# Patient Record
Sex: Male | Born: 1945 | Race: White | Hispanic: No | Marital: Married | Smoking: Never smoker
Health system: Southern US, Community
[De-identification: ages and names within clinical notes are randomized; demographics above are authoritative.]

## PROBLEM LIST (undated history)

## (undated) DIAGNOSIS — D649 Anemia, unspecified: Secondary | ICD-10-CM

## (undated) DIAGNOSIS — I10 Essential (primary) hypertension: Secondary | ICD-10-CM

## (undated) DIAGNOSIS — R011 Cardiac murmur, unspecified: Secondary | ICD-10-CM

## (undated) DIAGNOSIS — Q400 Congenital hypertrophic pyloric stenosis: Secondary | ICD-10-CM

## (undated) DIAGNOSIS — I350 Nonrheumatic aortic (valve) stenosis: Secondary | ICD-10-CM

## (undated) DIAGNOSIS — J189 Pneumonia, unspecified organism: Secondary | ICD-10-CM

## (undated) DIAGNOSIS — K219 Gastro-esophageal reflux disease without esophagitis: Secondary | ICD-10-CM

## (undated) DIAGNOSIS — E785 Hyperlipidemia, unspecified: Secondary | ICD-10-CM

## (undated) DIAGNOSIS — R55 Syncope and collapse: Secondary | ICD-10-CM

## (undated) DIAGNOSIS — D500 Iron deficiency anemia secondary to blood loss (chronic): Secondary | ICD-10-CM

## (undated) HISTORY — PX: COLONOSCOPY: SHX174

## (undated) HISTORY — DX: Anemia, unspecified: D64.9

## (undated) HISTORY — PX: TONSILLECTOMY: SUR1361

## (undated) HISTORY — DX: Gastro-esophageal reflux disease without esophagitis: K21.9

## (undated) HISTORY — DX: Cardiac murmur, unspecified: R01.1

## (undated) HISTORY — DX: Essential (primary) hypertension: I10

## (undated) HISTORY — DX: Nonrheumatic aortic (valve) stenosis: I35.0

## (undated) HISTORY — DX: Hyperlipidemia, unspecified: E78.5

## (undated) HISTORY — PX: BLEPHAROPLASTY: SUR158

## (undated) HISTORY — PX: CHOLECYSTECTOMY: SHX55

## (undated) HISTORY — PX: EYE SURGERY: SHX253

## (undated) HISTORY — DX: Iron deficiency anemia secondary to blood loss (chronic): D50.0

## (undated) HISTORY — PX: UPPER GASTROINTESTINAL ENDOSCOPY: SHX188

---

## 2013-03-19 ENCOUNTER — Emergency Department (HOSPITAL_COMMUNITY): Admission: EM | Admit: 2013-03-19 | Discharge: 2013-03-19 | Discharge status: Against Medical Advice | Payer: Self-pay

## 2015-10-17 HISTORY — PX: BLEPHAROPLASTY: SUR158

## 2017-01-16 ENCOUNTER — Encounter: Payer: Self-pay | Admitting: Gastroenterology

## 2017-01-25 ENCOUNTER — Encounter: Payer: Self-pay | Admitting: Gastroenterology

## 2017-01-25 ENCOUNTER — Encounter (INDEPENDENT_AMBULATORY_CARE_PROVIDER_SITE_OTHER): Payer: Self-pay

## 2017-01-25 ENCOUNTER — Ambulatory Visit (INDEPENDENT_AMBULATORY_CARE_PROVIDER_SITE_OTHER): Payer: MEDICARE | Admitting: Gastroenterology

## 2017-01-25 ENCOUNTER — Other Ambulatory Visit (INDEPENDENT_AMBULATORY_CARE_PROVIDER_SITE_OTHER): Payer: MEDICARE

## 2017-01-25 VITALS — BP 132/78 | HR 78 | Ht 67.0 in | Wt 157.0 lb

## 2017-01-25 DIAGNOSIS — D649 Anemia, unspecified: Secondary | ICD-10-CM

## 2017-01-25 DIAGNOSIS — R195 Other fecal abnormalities: Secondary | ICD-10-CM

## 2017-01-25 DIAGNOSIS — D500 Iron deficiency anemia secondary to blood loss (chronic): Secondary | ICD-10-CM

## 2017-01-25 HISTORY — DX: Iron deficiency anemia secondary to blood loss (chronic): D50.0

## 2017-01-25 LAB — CBC WITH DIFFERENTIAL/PLATELET
BASOS ABS: 0.1 10*3/uL (ref 0.0–0.1)
Basophils Relative: 0.7 % (ref 0.0–3.0)
EOS ABS: 0.2 10*3/uL (ref 0.0–0.7)
Eosinophils Relative: 2.8 % (ref 0.0–5.0)
HCT: 31.6 % — ABNORMAL LOW (ref 39.0–52.0)
Hemoglobin: 9.7 g/dL — ABNORMAL LOW (ref 13.0–17.0)
LYMPHS ABS: 1.6 10*3/uL (ref 0.7–4.0)
Lymphocytes Relative: 23 % (ref 12.0–46.0)
MCHC: 30.8 g/dL (ref 30.0–36.0)
MCV: 71.3 fl — AB (ref 78.0–100.0)
MONOS PCT: 9.6 % (ref 3.0–12.0)
Monocytes Absolute: 0.7 10*3/uL (ref 0.1–1.0)
NEUTROS ABS: 4.4 10*3/uL (ref 1.4–7.7)
NEUTROS PCT: 63.9 % (ref 43.0–77.0)
PLATELETS: 327 10*3/uL (ref 150.0–400.0)
RBC: 4.44 Mil/uL (ref 4.22–5.81)
RDW: 17.1 % — ABNORMAL HIGH (ref 11.5–15.5)
WBC: 7 10*3/uL (ref 4.0–10.5)

## 2017-01-25 MED ORDER — PANTOPRAZOLE SODIUM 40 MG PO TBEC: 40.0000 mg | DELAYED_RELEASE_TABLET | Freq: Two times a day (BID) | ORAL | 0 refills | Status: DC

## 2017-01-25 NOTE — Patient Instructions (Addendum)
Your physician has requested that you go to the basement for the following lab work before leaving today: CBC  You have been scheduled for an endoscopy. Please follow written instructions given to you at your visit today. If you use inhalers (even only as needed), please bring them with you on the day of your procedure.  We have sent the following medications to your pharmacy for you to pick up at your convenience: Pantoprazole  take twice a day.  Normal BMI (Body Mass Index- based on height and weight) is between 23 and 30. Your BMI today is Body mass index is 24.59 kg/m. Marland Kitchen Please consider follow up  regarding your BMI with your Primary Care Provider.  Thank you

## 2017-01-25 NOTE — Progress Notes (Addendum)
01/25/2017 Gaylene Brooks [id number] [birth year]  CC:  Anemia and heme positive stool  HISTORY OF PRESENT ILLNESS:  This is a 71 year old male who is new to our office. His previous GI physician was Dr. Aleene Davidson in [place], [place]. He underwent colonoscopy in January 2014 at which time the study was normal except for external hemorrhoids. He had an EGD in May 2015 at which time he was found to have a 2 cm hiatal hernia. Esophagus was biopsied and showed changes consistent with reflux esophagitis. He has been maintained on omeprazole daily since that time. He says that on Easter he had a syncopal episode at church for which he was taken to the emergency department. During is hospitalization there he underwent extensive cardiac evaluation. He does have aortic stenosis so that was re-evaluated.  Everything checked out from a cardiac standpoint to be stable. He started having severe black diarrhea while he was in the hospital. He said that he thinks he probably had 50 bowel movements in 1 day. C. difficile was negative. Stools were Hemoccult positive. When he presented to the emergency department his hemoglobin was around 10 g. From the notes it appears that his baseline had been about 12-13 g. They apparently did not have GI coverage at the hospital at that time so they elected to monitor him and transfer him out if he became unstable from a GI standpoint. While he was there the black stools resolved as well as the diarrhea. Hemoglobin did not go any lower then about 9 g or so. He was discharged and told to follow up with GI as an outpatient. He tells me that he is feeling great. He is on pantoprazole 40 mg twice a day, which he was switched to during the hospital stay from his daily omeprazole. He was having some epigastric pain as well and that has also resolved. He denies NSAID use. He takes a baby aspirin daily.   No past medical history on file. No past surgical history on file.  has no  tobacco, alcohol, and drug history on file. family history is not on file. Allergies  Allergen Reactions  . Levaquin [Levofloxacin In D5w]       No outpatient encounter prescriptions on file as of 01/25/2017.   No facility-administered encounter medications on file as of 01/25/2017.      REVIEW OF SYSTEMS  : All other systems reviewed and negative except where noted in the History of Present Illness.   PHYSICAL EXAM: Ht  (1.702 m)   Wt 157 lb (71.2 kg)   BMI 24.59 kg/m  General: Well developed white male in no acute distress Head: Normocephalic and atraumatic Eyes:  Sclerae anicteric, conjunctiva pink. Ears: Normal auditory acuity Lungs: Clear throughout to auscultation Heart: Regular rate and rhythm; harsh systolic murmur noted. Abdomen: Soft, non-distended. Normal bowel sounds.  Non-tender. Musculoskeletal: Symmetrical with no gross deformities  Skin: No lesions on visible extremities Extremities: No edema  Neurological: Alert oriented x 4, grossly non-focal Psychological:  Alert and cooperative. Normal mood and affect  ASSESSMENT AND PLAN: *70 year old male recently hospitalized in [place] after a syncopal episode. Hemoglobin found to be down 2-3 g from his baseline. He was having diarrhea with black stools, which were Hemoccult positive. Cardiac evaluation checked out okay in regards to the syncope. He is here for outpatient GI follow-up.  On pantoprazole 40 mg BID.  Feels much better.  I suspect that he may have had an UGIB from an ulcer or  other source.  Will schedule for EGD with Dr. Leone Payor.  Will continue pantoprazole 40 mg BID for now.  Will check CBC today.  **The risks, benefits, and alternatives to EGD were discussed with the patient and he consents to proceed.   Agree with Ms. Rise Mu management.  EGD ended up being essentially negative but turns out he has been a regular blood donor so suspect that was cause of Fe Defic anemia  Iva Boop, MD,  [facility]

## 2017-02-21 ENCOUNTER — Encounter: Payer: Self-pay | Admitting: Internal Medicine

## 2017-02-21 ENCOUNTER — Ambulatory Visit (AMBULATORY_SURGERY_CENTER): Payer: MEDICARE | Admitting: Internal Medicine

## 2017-02-21 VITALS — BP 132/66 | HR 68 | Temp 98.9°F | Resp 10 | Ht 67.0 in | Wt 157.0 lb

## 2017-02-21 DIAGNOSIS — K921 Melena: Secondary | ICD-10-CM

## 2017-02-21 DIAGNOSIS — D509 Iron deficiency anemia, unspecified: Secondary | ICD-10-CM

## 2017-02-21 DIAGNOSIS — D500 Iron deficiency anemia secondary to blood loss (chronic): Secondary | ICD-10-CM

## 2017-02-21 MED ORDER — SODIUM CHLORIDE 0.9 % IV SOLN: 500.0000 mL | INTRAVENOUS | Status: DC

## 2017-02-21 MED ORDER — PANTOPRAZOLE SODIUM 40 MG PO TBEC: 40.0000 mg | DELAYED_RELEASE_TABLET | Freq: Every day | ORAL | 0 refills | Status: DC

## 2017-02-21 MED ORDER — PANTOPRAZOLE SODIUM 40 MG PO TBEC: 40.0000 mg | DELAYED_RELEASE_TABLET | Freq: Every day | ORAL | 3 refills | Status: DC

## 2017-02-21 MED ORDER — FERROUS SULFATE 325 (65 FE) MG PO TABS: 325.0000 mg | ORAL_TABLET | Freq: Two times a day (BID) | ORAL | 3 refills | Status: DC

## 2017-02-21 NOTE — Progress Notes (Signed)
Pt's states no medical or surgical changes since previsit or office visit. 

## 2017-02-21 NOTE — Patient Instructions (Addendum)
This exam was normal - no cause of black stools and bleeding seen.  Your red blood cells are small and this is consistent with low iron from the bleeding and possibly other causes.  Please start ferrous sulfate 325 mg twice a day (iron pill) can get at any pharmacy. It will make stools dark again.  Change pantoprazole to once a day before breakfast.  Since this test did not give us an answer I am recommending a colonoscopy - it was right to do this one but since no answer need to check the colon. We will arrange this.  I appreciate the opportunity to care for you. Iva Booparl E. Lucrezia Dehne, MD, FACG   YOU HAD AN ENDOSCOPIC PROCEDURE TODAY AT THE [facility]:   Refer to the procedure report that was given to you for any specific questions about what was found during the examination.  If the procedure report does not answer your questions, please call your gastroenterologist to clarify.  If you requested that your care partner not be given the details of your procedure findings, then the procedure report has been included in a sealed envelope for you to review at your convenience later.  YOU SHOULD EXPECT: Some feelings of bloating in the abdomen. Passage of more gas than usual.  Walking can help get rid of the air that was put into your GI tract during the procedure and reduce the bloating.   Please Note:  You might notice some irritation and congestion in your nose or some drainage.  This is from the oxygen used during your procedure.  There is no need for concern and it should clear up in a day or so.  SYMPTOMS TO REPORT IMMEDIATELY:    Following upper endoscopy (EGD)  Vomiting of blood or coffee ground material  New chest pain or pain under the shoulder blades  Painful or persistently difficult swallowing  New shortness of breath  Fever of 100F or higher  Black, tarry-looking stools  For urgent or emergent issues, a gastroenterologist can be reached at any hour by  calling [phone].   DIET:  We do recommend a small meal at first, but then you may proceed to your regular diet.  Drink plenty of fluids but you should avoid alcoholic beverages for 24 hours.  ACTIVITY:  You should plan to take it easy for the rest of today and you should NOT DRIVE or use heavy machinery until tomorrow (because of the sedation medicines used during the test).    FOLLOW UP: Our staff will call the number listed on your records the next business day following your procedure to check on you and address any questions or concerns that you may have regarding the information given to you following your procedure. If we do not reach you, we will leave a message.  However, if you are feeling well and you are not experiencing any problems, there is no need to return our call.  We will assume that you have returned to your regular daily activities without incident.  If any biopsies were taken you will be contacted by phone or by letter within the next 1-3 weeks.  Please call us at [phone] if you have not heard about the biopsies in 3 weeks.    SIGNATURES/CONFIDENTIALITY: You and/or your care partner have signed paperwork which will be entered into your electronic medical record.  These signatures attest to the fact that that the information above on your After Visit Summary has  been reviewed and is understood.  Full responsibility of the confidentiality of this discharge information lies with you and/or your care-partner.  Dr. Has changed his mind about the colonoscopy.  You are to come to see him in about two months.

## 2017-02-21 NOTE — Progress Notes (Signed)
Report to PACU, RN, vss, BBS= Clear.  

## 2017-02-21 NOTE — Op Note (Addendum)
[facility] Patient Name: Brent Jordan Procedure Date: 02/21/2017 3:38 PM MRN: [id number] Endoscopist: Iva Boop , MD Age: 71 Referring MD:  Date of Birth: [birth year] Gender: Male Account #: [id number] Procedure:                Upper GI endoscopy Indications:              Iron deficiency anemia, Melena Medicines:                Propofol per Anesthesia, Monitored Anesthesia Care Procedure:                Pre-Anesthesia Assessment:                           - Prior to the procedure, a History and Physical                            was performed, and patient medications and                            allergies were reviewed. The patient's tolerance of                            previous anesthesia was also reviewed. The risks                            and benefits of the procedure and the sedation                            options and risks were discussed with the patient.                            All questions were answered, and informed consent                            was obtained. Prior Anticoagulants: The patient                            last took aspirin 1 day and previous NSAID                            medication 1 day prior to the procedure. ASA Grade                            Assessment: II - A patient with mild systemic                            disease. After reviewing the risks and benefits,                            the patient was deemed in satisfactory condition to                            undergo the procedure.  After obtaining informed consent, the endoscope was                            passed under direct vision. Throughout the                            procedure, the patient's blood pressure, pulse, and                            oxygen saturations were monitored continuously. The                            Endoscope was introduced through the mouth, and                            advanced to the fourth part  of duodenum. The upper                            GI endoscopy was accomplished without difficulty.                            The patient tolerated the procedure well. Scope In: Scope Out: Findings:                 The esophagus was normal.                           The stomach was normal.                           The examined duodenum was normal. Into D4. Complications:            No immediate complications. Estimated Blood Loss:     Estimated blood loss: none. Impression:               - Normal esophagus.                           - Normal stomach.                           - Normal examined duodenum.                           - No specimens collected. Recommendation:           - Patient has a contact number available for                            emergencies. The signs and symptoms of potential                            delayed complications were discussed with the                            patient. Return to normal activities tomorrow.  Written discharge instructions were provided to the                            patient.                           - Resume previous diet.                           - Continue present medications.                           - Ferrous sulfate at 325 mg orally BID. [follow up].                           - Change pantoprazole to qd                           ? 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                           I think he became anemic from  donating blood and                            microcytic/Fe deficient - had heme + diarrheal                            illness ? melena -                           plan Tx w/ iron                           no more blood donation  see me 2 mos Iva Booparl E Artez Regis, MD 02/21/2017 4:03:04 PM This report has been signed electronically.

## 2017-02-22 ENCOUNTER — Telehealth: Payer: Self-pay

## 2017-02-22 ENCOUNTER — Telehealth: Payer: Self-pay | Admitting: Internal Medicine

## 2017-02-22 MED ORDER — PANTOPRAZOLE SODIUM 40 MG PO TBEC: 40.0000 mg | DELAYED_RELEASE_TABLET | Freq: Every day | ORAL | 3 refills | Status: DC

## 2017-02-22 NOTE — Telephone Encounter (Signed)
  Follow up Call-  Call back number 02/21/2017  Post procedure Call Back phone  # [phone]; may call cell as well [phone]  Permission to leave phone message Yes  Some recent data might be hidden     Patient questions:  Do you have a fever, pain , or abdominal swelling? No. Pain Score  0 *  Have you tolerated food without any problems? Yes.    Have you been able to return to your normal activities? Yes.    Do you have any questions about your discharge instructions: Diet   No. Medications  No. Follow up visit  No.  Do you have questions or concerns about your Care? No.  Actions: * If pain score is 4 or above: No action needed, pain <4.

## 2017-02-22 NOTE — Telephone Encounter (Signed)
Wife requests that Pantoprazole sent to [facility].  Done

## 2017-03-29 ENCOUNTER — Other Ambulatory Visit: Payer: Self-pay | Admitting: Gastroenterology

## 2017-04-17 ENCOUNTER — Encounter: Payer: Self-pay | Admitting: Internal Medicine

## 2017-04-17 ENCOUNTER — Other Ambulatory Visit (INDEPENDENT_AMBULATORY_CARE_PROVIDER_SITE_OTHER): Payer: MEDICARE

## 2017-04-17 ENCOUNTER — Ambulatory Visit (INDEPENDENT_AMBULATORY_CARE_PROVIDER_SITE_OTHER): Payer: MEDICARE | Admitting: Internal Medicine

## 2017-04-17 DIAGNOSIS — D500 Iron deficiency anemia secondary to blood loss (chronic): Secondary | ICD-10-CM

## 2017-04-17 LAB — CBC WITH DIFFERENTIAL/PLATELET
BASOS PCT: 0.7 % (ref 0.0–3.0)
Basophils Absolute: 0 10*3/uL (ref 0.0–0.1)
EOS ABS: 0.2 10*3/uL (ref 0.0–0.7)
EOS PCT: 4 % (ref 0.0–5.0)
HCT: 44.4 % (ref 39.0–52.0)
Hemoglobin: 14.9 g/dL (ref 13.0–17.0)
LYMPHS PCT: 23.8 % (ref 12.0–46.0)
Lymphs Abs: 1.4 10*3/uL (ref 0.7–4.0)
MCHC: 33.5 g/dL (ref 30.0–36.0)
MCV: 80.5 fl (ref 78.0–100.0)
MONO ABS: 0.6 10*3/uL (ref 0.1–1.0)
Monocytes Relative: 10.4 % (ref 3.0–12.0)
NEUTROS PCT: 61.1 % (ref 43.0–77.0)
Neutro Abs: 3.7 10*3/uL (ref 1.4–7.7)
PLATELETS: 197 10*3/uL (ref 150.0–400.0)
RBC: 5.52 Mil/uL (ref 4.22–5.81)
RDW: 26.2 % — ABNORMAL HIGH (ref 11.5–15.5)
WBC: 6 10*3/uL (ref 4.0–10.5)

## 2017-04-17 LAB — FERRITIN: FERRITIN: 41.2 ng/mL (ref 22.0–322.0)

## 2017-04-17 NOTE — Patient Instructions (Addendum)
  Your physician has requested that you go to the basement for the following lab work before leaving today: CBC/diff, Ferritin  Normal BMI (Body Mass Index- based on height and weight) is between 23 and 30. Your BMI today is Body mass index is 24.63 kg/m. Marland Kitchen. Please consider follow up  regarding your BMI with your Primary Care Provider.   I appreciate the opportunity to care for you. Stan Headarl Gessner, MD, [facility]

## 2017-04-17 NOTE — Progress Notes (Signed)
   Brent BrooksMichael Nakamura 71 y.o. 06/20/1946 [id number]  Assessment & Plan:   Encounter Diagnosis  Name Primary?  . 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 Levaquin [Levofloxacin In D5w]    Current Meds  Medication Sig  . aspirin 81 MG chewable tablet Chew by mouth daily.  Marland Kitchen. atorvastatin (LIPITOR) 20 MG tablet Take 20 mg by mouth daily.  . Calcium Carbonate-Vitamin D (CALCIUM 600+D) 600-200 MG-UNIT TABS Take by mouth.  . cetirizine (ZYRTEC) 10 MG tablet Take 10 mg by mouth daily.  . diclofenac (VOLTAREN) 75 MG EC tablet Take 75 mg by mouth 2 (two) times daily.  . ferrous sulfate 325 (65 FE) MG tablet Take 1 tablet (325 mg total) by mouth 2 (two) times daily with a meal.  . montelukast (SINGULAIR) 10 MG tablet Take 10 mg by mouth at bedtime.  . pantoprazole (PROTONIX) 40 MG tablet TAKE 1 TABLET BY MOUTH TWICE A DAY  . tamsulosin (FLOMAX) 0.4 MG CAPS capsule Take 0.4 mg by mouth.  . triamcinolone (NASACORT ALLERGY 24HR) 55 MCG/ACT AERO nasal inhaler Place 2 sprays into the nose daily.  . vitamin E 400 UNIT capsule Take 400 Units by mouth daily.   Past Medical History:  Diagnosis Date  . Anemia   . Aortic valve stenosis   . GERD (gastroesophageal reflux disease)   . Heart murmur   . Hyperlipidemia   . Hypertension   . 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 BLEPHAROPLASTY    . CHOLECYSTECTOMY    . COLONOSCOPY    . EYE SURGERY    . TONSILLECTOMY    . UPPER GASTROINTESTINAL ENDOSCOPY       Review of Systems  As above Objective:   Physical Exam BP 120/76   Pulse 72   Ht 5\' 6"  (1.676 m)   Wt 152 lb 9.6 oz (69.2 kg)   BMI 24.63 kg/m

## 2017-04-17 NOTE — Assessment & Plan Note (Addendum)
Symptomatically better CBC and ferritin today

## 2017-04-18 ENCOUNTER — Encounter: Payer: Self-pay | Admitting: Internal Medicine

## 2017-04-19 NOTE — Progress Notes (Signed)
Hemoglobin is now normal 14.9 Iron not fully restored so reduce to daily iron tablet or every other day if still too constipoated F/u PCP for recheck - not urgent but later this year WU:[id number]:Hungarland, Carlena BjornstadJohn David, MD

## 2019-05-20 ENCOUNTER — Other Ambulatory Visit: Payer: Self-pay | Admitting: Neurosurgery

## 2019-05-20 DIAGNOSIS — M19011 Primary osteoarthritis, right shoulder: Secondary | ICD-10-CM

## 2019-06-17 ENCOUNTER — Other Ambulatory Visit: Payer: MEDICARE

## 2019-07-14 HISTORY — PX: ANOMALOUS PULMONARY VENOUS RETURN REPAIR, TOTAL: SHX1156

## 2019-11-24 ENCOUNTER — Other Ambulatory Visit: Payer: Self-pay | Admitting: Orthopedic Surgery

## 2019-12-29 NOTE — Patient Instructions (Addendum)
DUE TO COVID-19 ONLY ONE VISITOR IS ALLOWED TO COME WITH YOU AND STAY IN THE WAITING ROOM ONLY DURING PRE OP AND PROCEDURE DAY OF SURGERY. THE 1 VISITOR MAY VISIT WITH YOU AFTER SURGERY IN YOUR PRIVATE ROOM DURING VISITING HOURS ONLY!  YOU NEED TO HAVE A COVID 19 TEST ON 01-05-20 @ 1:35 PM, THIS TEST MUST BE DONE BEFORE SURGERY, COME  [address], [place] , [address].  [facility]) ONCE YOUR COVID TEST IS COMPLETED, PLEASE BEGIN THE QUARANTINE INSTRUCTIONS AS OUTLINED IN YOUR HANDOUT.                Brent Jordan  12/29/2019   Your procedure is scheduled on: 01-08-20   Report to [facility] Main  Entrance    Report to Admitting at 5:30AM     Call this number if you have problems the morning of surgery [phone]    Remember: NO SOLID FOOD AFTER MIDNIGHT THE NIGHT PRIOR TO SURGERY. NOTHING BY MOUTH EXCEPT CLEAR LIQUIDS UNTIL 4:30 AM . PLEASE FINISH ENSURE DRINK PER SURGEON ORDER  WHICH NEEDS TO BE COMPLETED AT 4:30 AM.   CLEAR LIQUID DIET   Foods Allowed                                                                     Foods Excluded  Coffee and tea, regular and decaf                             liquids that you cannot  Plain Jell-O any favor except red or purple                                           see through such as: Fruit ices (not with fruit pulp)                                     milk, soups, orange juice  Iced Popsicles                                    All solid food Carbonated beverages, regular and diet                                    Cranberry, grape and apple juices Sports drinks like Gatorade Lightly seasoned clear broth or consume(fat free) Sugar, honey syrup   _____________________________________________________________________     Take these medicines the morning of surgery with A SIP OF WATER: Cetirizine (Zyrtec), Percocet, Flomax (Tamsulosin) and Omeprazole (Prilosec)   BRUSH YOUR TEETH MORNING OF SURGERY AND  RINSE YOUR MOUTH OUT, NO CHEWING GUM CANDY OR MINTS.                                You may not have any metal on  your body including hair pins and              piercings     Do not wear jewelry, cologne, lotions, powders or  deodorant               Men may shave face and neck.   Do not bring valuables to the hospital. [facility] IS NOT             RESPONSIBLE   FOR VALUABLES.  Contacts, dentures or bridgework may not be worn into surgery.  You may bring your overnight bag     :  Special Instructions: N/A              Please read over the following fact sheets you were given: _____________________________________________________________________  [facility]    Before surgery, you can play an important role. Because skin is not sterile, your skin needs to be as free of germs as possible. You can reduce the number of germs on your skin by using the following products. . Benzoyl Peroxide Gel o Reduces the number of germs present on the skin o Applied twice a day to shoulder area starting two days before surgery    ==================================================================  Please follow these instructions carefully:  BENZOYL PEROXIDE 5% GEL  Please do not use if you have an allergy to benzoyl peroxide.   If your skin becomes reddened/irritated stop using the benzoyl peroxide.  Starting two days before surgery, apply as follows: 1. Apply benzoyl peroxide in the morning and at night. Apply after taking a shower. If you are not taking a shower clean entire shoulder front, back, and side along with the armpit with a clean wet washcloth.  2. Place a quarter-sized dollop on your shoulder and rub in thoroughly, making sure to cover the front, back, and side of your shoulder, along with the armpit.   2 days before ____ AM   ____ PM              1 day before ____ AM   ____ PM                         3. Do this twice a day for two  days.  (Last application is the night before surgery, AFTER using the CHG soap as described below).  4. Do NOT apply benzoyl peroxide gel on the day of surgery.            [place] - Preparing for Surgery Before surgery, you can play an important role.  Because skin is not sterile, your skin needs to be as free of germs as possible.  You can reduce the number of germs on your skin by washing with CHG (chlorahexidine gluconate) soap before surgery.  CHG is an antiseptic cleaner which kills germs and bonds with the skin to continue killing germs even after washing. Please DO NOT use if you have an allergy to CHG or antibacterial soaps.  If your skin becomes reddened/irritated stop using the CHG and inform your nurse when you arrive at Short Stay. Do not shave (including legs and underarms) for at least 48 hours prior to the first CHG shower.  You may shave your face/neck. Please follow these instructions carefully:  1.  Shower with CHG Soap the night before surgery and the  morning of Surgery.  2.  If you choose to wash your hair, wash your hair  first as usual with your  normal  shampoo.  3.  After you shampoo, rinse your hair and body thoroughly to remove the  shampoo.                           4.  Use CHG as you would any other liquid soap.  You can apply chg directly  to the skin and wash                       Gently with a scrungie or clean washcloth.  5.  Apply the CHG Soap to your body ONLY FROM THE NECK DOWN.   Do not use on face/ open                           Wound or open sores. Avoid contact with eyes, ears mouth and genitals (private parts).                       Wash face,  Genitals (private parts) with your normal soap.             6.  Wash thoroughly, paying special attention to the area where your surgery  will be performed.  7.  Thoroughly rinse your body with warm water from the neck down.  8.  DO NOT shower/wash with your normal soap after using and rinsing off  the CHG  Soap.                9.  Pat yourself dry with a clean towel.            10.  Wear clean pajamas.            11.  Place clean sheets on your bed the night of your first shower and do not  sleep with pets. Day of Surgery : Do not apply any lotions/deodorants the morning of surgery.  Please wear clean clothes to the hospital/surgery center.  FAILURE TO FOLLOW THESE INSTRUCTIONS MAY RESULT IN THE CANCELLATION OF YOUR SURGERY PATIENT SIGNATURE_________________________________  NURSE SIGNATURE__________________________________  ________________________________________________________________________   Brent Jordan  An incentive spirometer is a tool that can help keep your lungs clear and active. This tool measures how well you are filling your lungs with each breath. Taking long deep breaths may help reverse or decrease the chance of developing breathing (pulmonary) problems (especially infection) following:  A long period of time when you are unable to move or be active. BEFORE THE PROCEDURE   If the spirometer includes an indicator to show your best effort, your nurse or respiratory therapist will set it to a desired goal.  If possible, sit up straight or lean slightly forward. Try not to slouch.  Hold the incentive spirometer in an upright position. INSTRUCTIONS FOR USE  1. Sit on the edge of your bed if possible, or sit up as far as you can in bed or on a chair. 2. Hold the incentive spirometer in an upright position. 3. Breathe out normally. 4. Place the mouthpiece in your mouth and seal your lips tightly around it. 5. Breathe in slowly and as deeply as possible, raising the piston or the ball toward the top of the column. 6. Hold your breath for 3-5 seconds or for as long as possible. Allow the piston or ball to fall to the bottom of the column. 7. Remove the  mouthpiece from your mouth and breathe out normally. 8. Rest for a few seconds and repeat Steps 1 through 7 at  least 10 times every 1-2 hours when you are awake. Take your time and take a few normal breaths between deep breaths. 9. The spirometer may include an indicator to show your best effort. Use the indicator as a goal to work toward during each repetition. 10. After each set of 10 deep breaths, practice coughing to be sure your lungs are clear. If you have an incision (the cut made at the time of surgery), support your incision when coughing by placing a pillow or rolled up towels firmly against it. Once you are able to get out of bed, walk around indoors and cough well. You may stop using the incentive spirometer when instructed by your caregiver.  RISKS AND COMPLICATIONS  Take your time so you do not get dizzy or light-headed.  If you are in pain, you may need to take or ask for pain medication before doing incentive spirometry. It is harder to take a deep breath if you are having pain. AFTER USE  Rest and breathe slowly and easily.  It can be helpful to keep track of a log of your progress. Your caregiver can provide you with a simple table to help with this. If you are using the spirometer at home, follow these instructions: [place] IF:   You are having difficultly using the spirometer.  You have trouble using the spirometer as often as instructed.  Your pain medication is not giving enough relief while using the spirometer.  You develop fever of 100.5 F (38.1 C) or higher. SEEK IMMEDIATE MEDICAL CARE IF:   You cough up bloody sputum that had not been present before.  You develop fever of 102 F (38.9 C) or greater.  You develop worsening pain at or near the incision site. MAKE SURE YOU:   Understand these instructions.  Will watch your condition.  Will get help right away if you are not doing well or get worse. Document Released: 02/12/2007 Document Revised: 12/25/2011 Document Reviewed: 04/15/2007 ExitCare Patient Information 2014 ExitCare,  [place].   ________________________________________________________________________  WHAT IS A BLOOD TRANSFUSION? Blood Transfusion Information  A transfusion is the replacement of blood or some of its parts. Blood is made up of multiple cells which provide different functions.  Red blood cells carry oxygen and are used for blood loss replacement.  White blood cells fight against infection.  Platelets control bleeding.  Plasma helps clot blood.  Other blood products are available for specialized needs, such as hemophilia or other clotting disorders. BEFORE THE TRANSFUSION  Who gives blood for transfusions?   Healthy volunteers who are fully evaluated to make sure their blood is safe. This is blood bank blood. Transfusion therapy is the safest it has ever been in the practice of medicine. Before blood is taken from a donor, a complete history is taken to make sure that person has no history of diseases nor engages in risky social behavior (examples are intravenous drug use or sexual activity with multiple partners). The donor's travel history is screened to minimize risk of transmitting infections, such as malaria. The donated blood is tested for signs of infectious diseases, such as HIV and hepatitis. The blood is then tested to be sure it is compatible with you in order to minimize the chance of a transfusion reaction. If you or a relative donates blood, this is often done in anticipation of surgery  and is not appropriate for emergency situations. It takes many days to process the donated blood. RISKS AND COMPLICATIONS Although transfusion therapy is very safe and saves many lives, the main dangers of transfusion include:   Getting an infectious disease.  Developing a transfusion reaction. This is an allergic reaction to something in the blood you were given. Every precaution is taken to prevent this. The decision to have a blood transfusion has been considered carefully by your caregiver  before blood is given. Blood is not given unless the benefits outweigh the risks. AFTER THE TRANSFUSION  Right after receiving a blood transfusion, you will usually feel much better and more energetic. This is especially true if your red blood cells have gotten low (anemic). The transfusion raises the level of the red blood cells which carry oxygen, and this usually causes an energy increase.  The nurse administering the transfusion will monitor you carefully for complications. HOME CARE INSTRUCTIONS  No special instructions are needed after a transfusion. You may find your energy is better. Speak with your caregiver about any limitations on activity for underlying diseases you may have. SEEK MEDICAL CARE IF:   Your condition is not improving after your transfusion.  You develop redness or irritation at the intravenous (IV) site. SEEK IMMEDIATE MEDICAL CARE IF:  Any of the following symptoms occur over the next 12 hours:  Shaking chills.  You have a temperature by mouth above 102 F (38.9 C), not controlled by medicine.  Chest, back, or muscle pain.  People around you feel you are not acting correctly or are confused.  Shortness of breath or difficulty breathing.  Dizziness and fainting.  You get a rash or develop hives.  You have a decrease in urine output.  Your urine turns a dark color or changes to pink, red, or brown. Any of the following symptoms occur over the next 10 days:  You have a temperature by mouth above 102 F (38.9 C), not controlled by medicine.  Shortness of breath.  Weakness after normal activity.  The white part of the eye turns yellow (jaundice).  You have a decrease in the amount of urine or are urinating less often.  Your urine turns a dark color or changes to pink, red, or brown. Document Released: 09/29/2000 Document Revised: 12/25/2011 Document Reviewed: 05/18/2008 [facility] Patient Information 2014 [place],  [place].  _______________________________________________________________________

## 2019-12-29 NOTE — Progress Notes (Signed)
PCP - Maximiano Coss, MD Cardiologist -   Chest x-ray -  EKG -  Stress Test -  ECHO -  Cardiac Cath -   Sleep Study -  CPAP -   Fasting Blood Sugar -  Checks Blood Sugar _____ times a day  Blood Thinner Instructions: Aspirin Instructions: Last Dose:  Anesthesia review:   Patient denies shortness of breath, fever, cough and chest pain at PAT appointment   Patient verbalized understanding of instructions that were given to them at the PAT appointment. Patient was also instructed that they will need to review over the PAT instructions again at home before surgery.

## 2019-12-30 ENCOUNTER — Encounter (HOSPITAL_COMMUNITY): Payer: Self-pay

## 2019-12-30 ENCOUNTER — Other Ambulatory Visit: Payer: Self-pay

## 2019-12-30 ENCOUNTER — Encounter (HOSPITAL_COMMUNITY)
Admission: RE | Admit: 2019-12-30 | Discharge: 2019-12-30 | Disposition: A | Discharge status: Home / Self Care | Payer: MEDICARE | Source: Ambulatory Visit | Attending: Orthopedic Surgery | Admitting: Orthopedic Surgery

## 2020-01-01 ENCOUNTER — Ambulatory Visit (HOSPITAL_COMMUNITY)
Admission: RE | Admit: 2020-01-01 | Discharge: 2020-01-01 | Disposition: A | Discharge status: Home / Self Care | Payer: MEDICARE | Source: Ambulatory Visit | Attending: Orthopedic Surgery | Admitting: Orthopedic Surgery

## 2020-01-01 ENCOUNTER — Encounter (HOSPITAL_COMMUNITY)
Admission: RE | Admit: 2020-01-01 | Discharge: 2020-01-01 | Disposition: A | Discharge status: Home / Self Care | Payer: MEDICARE | Source: Ambulatory Visit | Attending: Orthopedic Surgery | Admitting: Orthopedic Surgery

## 2020-01-01 ENCOUNTER — Other Ambulatory Visit: Payer: Self-pay

## 2020-01-01 ENCOUNTER — Encounter (HOSPITAL_COMMUNITY): Payer: Self-pay | Admitting: Physician Assistant

## 2020-01-01 DIAGNOSIS — Z01811 Encounter for preprocedural respiratory examination: Secondary | ICD-10-CM | POA: Diagnosis present

## 2020-01-01 LAB — CBC WITH DIFFERENTIAL/PLATELET
Abs Immature Granulocytes: 0.01 10*3/uL (ref 0.00–0.07)
Basophils Absolute: 0 10*3/uL (ref 0.0–0.1)
Basophils Relative: 1 %
Eosinophils Absolute: 0.1 10*3/uL (ref 0.0–0.5)
Eosinophils Relative: 3 %
HCT: 46.2 % (ref 39.0–52.0)
Hemoglobin: 15.1 g/dL (ref 13.0–17.0)
Immature Granulocytes: 0 %
Lymphocytes Relative: 29 %
Lymphs Abs: 1.1 10*3/uL (ref 0.7–4.0)
MCH: 28.9 pg (ref 26.0–34.0)
MCHC: 32.7 g/dL (ref 30.0–36.0)
MCV: 88.3 fL (ref 80.0–100.0)
Monocytes Absolute: 0.4 10*3/uL (ref 0.1–1.0)
Monocytes Relative: 11 %
Neutro Abs: 2.3 10*3/uL (ref 1.7–7.7)
Neutrophils Relative %: 56 %
Platelets: 164 10*3/uL (ref 150–400)
RBC: 5.23 MIL/uL (ref 4.22–5.81)
RDW: 12.5 % (ref 11.5–15.5)
WBC: 4 10*3/uL (ref 4.0–10.5)
nRBC: 0 % (ref 0.0–0.2)

## 2020-01-01 LAB — COMPREHENSIVE METABOLIC PANEL
ALT: 23 U/L (ref 0–44)
AST: 23 U/L (ref 15–41)
Albumin: 4.4 g/dL (ref 3.5–5.0)
Alkaline Phosphatase: 77 U/L (ref 38–126)
Anion gap: 8 (ref 5–15)
BUN: 17 mg/dL (ref 8–23)
CO2: 29 mmol/L (ref 22–32)
Calcium: 9.7 mg/dL (ref 8.9–10.3)
Chloride: 104 mmol/L (ref 98–111)
Creatinine, Ser: 1.13 mg/dL (ref 0.61–1.24)
GFR calc Af Amer: >60 mL/min (ref >60)
GFR calc non Af Amer: >60 mL/min (ref >60)
Glucose, Bld: 116 mg/dL — ABNORMAL HIGH (ref 70–99)
Potassium: 4.5 mmol/L (ref 3.5–5.1)
Sodium: 141 mmol/L (ref 135–145)
Total Bilirubin: 0.9 mg/dL (ref 0.3–1.2)
Total Protein: 7.2 g/dL (ref 6.5–8.1)

## 2020-01-01 LAB — SURGICAL PCR SCREEN
MRSA, PCR: NEGATIVE
Staphylococcus aureus: NEGATIVE

## 2020-01-01 LAB — URINALYSIS, ROUTINE W REFLEX MICROSCOPIC
Bilirubin Urine: NEGATIVE
Glucose, UA: NEGATIVE mg/dL
Hgb urine dipstick: NEGATIVE
Ketones, ur: NEGATIVE mg/dL
Leukocytes,Ua: NEGATIVE
Nitrite: NEGATIVE
Protein, ur: NEGATIVE mg/dL
Specific Gravity, Urine: 1.009 (ref 1.005–1.030)
pH: 6 (ref 5.0–8.0)

## 2020-01-01 LAB — ABO/RH: ABO/RH(D): O POS

## 2020-01-01 LAB — APTT: aPTT: 33 seconds (ref 24–36)

## 2020-01-01 LAB — PROTIME-INR
INR: 1 (ref 0.8–1.2)
Prothrombin Time: 13.4 seconds (ref 11.4–15.2)

## 2020-01-02 ENCOUNTER — Encounter (HOSPITAL_COMMUNITY): Payer: Self-pay

## 2020-01-02 NOTE — Progress Notes (Signed)
Anesthesia Chart Review   Case: [id number] Date/Time: 01/08/20 0715   Procedure: REVERSE TOTAL SHOULDER ARTHROPLASTY (Right Shoulder)   Anesthesia type: Choice   Pre-op diagnosis: RIGHT SHOULDER ROTATOR CUFF ARTHROPATHY   Location: [facility] ROOM 07 / [facility]   Surgeons: Jones Broom, MD      DISCUSSION:74 y.o. never smoker with h/o GERD, HLD, HTN, s/p TAVR, right shoulder rotator cuff arthropathy scheduled for above procedure 01/08/20 with Dr. Jones Broom.    Cardiac clearance received, on chart.    Last Echo 08/2019 which shows normally functioning bioprosthetic vlave, no regurgitation. Mean systolic gradient 9 mmHg, peak systolic gradient 16 mmHg.   Anticipate pt can proceed with planned procedure barring acute status change.   VS: BP (!) 161/85 (BP Location: Right Arm)   Pulse 81   Temp 37 C (Oral)   Resp 18   Ht 5\' 7"  (1.702 m)   Wt 69.2 kg   SpO2 100%   BMI 23.88 kg/m   PROVIDERS: System, Pcp Not In  , MD is Cardiologist  LABS: Labs reviewed: Acceptable for surgery. (all labs ordered are listed, but only abnormal results are displayed)  Labs Reviewed  COMPREHENSIVE METABOLIC PANEL - Abnormal; Notable for the following components:      Result Value   Glucose, Bld 116 (*)    All other components within normal limits  URINALYSIS, ROUTINE W REFLEX MICROSCOPIC - Abnormal; Notable for the following components:   Color, Urine STRAW (*)    All other components within normal limits  SURGICAL PCR SCREEN  APTT  CBC WITH DIFFERENTIAL/PLATELET  PROTIME-INR  TYPE AND SCREEN     IMAGES:   EKG: 01/01/20 Rate 77 bpm  Normal sinus rhythm  Left axis deviation   CV: Echo 08/21/2019 Summary 1. Left ventricle: Systolic function was normal.  The estimated ejection fraction was in the range of 60-65%.  There was Grade I (mild) diastolic dysfunction appropriate for age.  2. Aortic valve: Prior repair procedures include transcatheter aortic valve replacement.   There is a normally functioning bioprosthetic valve.  No regurgitation.  The mean systolic gradient is 9 mmHg.  The peak systolic gradient is 16 mm Hg.  3. Right ventricle: The cavity size is normal.  Systolic function is normal.  4. Pulmonary arteries: Systolic pressure was within the normal range, estimated to be 29 mmHg.  Impressions: Date o fprior study 07/15/2019.  No significant change.  Past Medical History:  Diagnosis Date  . Anemia   . Aortic valve stenosis   . GERD (gastroesophageal reflux disease)   . Heart murmur   . Hyperlipidemia   . Hypertension   . 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 BLEPHAROPLASTY    . CHOLECYSTECTOMY    . COLONOSCOPY    . EYE SURGERY    . TONSILLECTOMY    . UPPER GASTROINTESTINAL ENDOSCOPY      MEDICATIONS: . acetaminophen (TYLENOL) 500 MG tablet  . aspirin EC 81 MG tablet  . atorvastatin (LIPITOR) 20 MG tablet  . Calcium Carbonate-Vitamin D (CALCIUM 600+D) 600-200 MG-UNIT TABS  . cetirizine (ZYRTEC) 10 MG tablet  . diclofenac (VOLTAREN) 75 MG EC tablet  . DULoxetine (CYMBALTA) 30 MG capsule  . magnesium oxide (MAG-OX) 400 MG tablet  . mirtazapine (REMERON) 15 MG tablet  . montelukast (SINGULAIR) 10 MG tablet  . omeprazole (PRILOSEC) 40 MG capsule  . oxyCODONE-acetaminophen (PERCOCET/ROXICET) 5-325  MG tablet  . tamsulosin (FLOMAX) 0.4 MG CAPS capsule  . Testosterone 1.62 % GEL  . triamcinolone (NASACORT ALLERGY 24HR) 55 MCG/ACT AERO nasal inhaler  . vitamin E 400 UNIT capsule   No current facility-administered medications for this encounter.     Maia Plan [facility] Pre-Surgical Testing [phone] 01/02/20  1:41 PM

## 2020-01-05 ENCOUNTER — Other Ambulatory Visit (HOSPITAL_COMMUNITY)
Admission: RE | Admit: 2020-01-05 | Discharge: 2020-01-05 | Disposition: A | Discharge status: Home / Self Care | Payer: MEDICARE | Source: Ambulatory Visit | Attending: Orthopedic Surgery | Admitting: Orthopedic Surgery

## 2020-01-05 DIAGNOSIS — Z01812 Encounter for preprocedural laboratory examination: Secondary | ICD-10-CM | POA: Insufficient documentation

## 2020-01-05 DIAGNOSIS — Z20822 Contact with and (suspected) exposure to covid-19: Secondary | ICD-10-CM | POA: Insufficient documentation

## 2020-01-05 LAB — SARS CORONAVIRUS 2 (TAT 6-24 HRS): SARS Coronavirus 2: NEGATIVE

## 2020-01-08 ENCOUNTER — Encounter (HOSPITAL_COMMUNITY): Admission: RE | Payer: Self-pay | Source: Ambulatory Visit

## 2020-01-08 ENCOUNTER — Ambulatory Visit (HOSPITAL_COMMUNITY): Admission: RE | Admit: 2020-01-08 | Payer: MEDICARE | Source: Ambulatory Visit | Admitting: Orthopedic Surgery

## 2020-01-08 LAB — TYPE AND SCREEN
ABO/RH(D): O POS
Antibody Screen: NEGATIVE

## 2020-01-08 SURGERY — ARTHROPLASTY, SHOULDER, TOTAL
Anesthesia: Choice | Site: Shoulder | Laterality: Right

## 2020-01-19 ENCOUNTER — Other Ambulatory Visit: Payer: Self-pay | Admitting: Orthopedic Surgery

## 2020-01-26 NOTE — Patient Instructions (Addendum)
DUE TO COVID-19 ONLY TWO VISITORS ARE ALLOWED TO COME WITH YOU AND STAY IN THE WAITING ROOM ONLY DURING PRE OP AND PROCEDURE. THE TWO VISITORS MAY VISIT WITH YOU IN YOUR PRIVATE ROOM DURING VISITING HOURS ONLY!!   COVID SWAB TESTING MUST BE COMPLETED ON:  Monday, February 02, 2020 at  1:30PM  [address], [place] -[facility] enter pre surgical testing line (Must self quarantine after testing. Follow instructions on handout.)             Your procedure is scheduled on: Thursday, February 05, 2020   Report to [facility] Main  Entrance    Report to admitting at 7:15 AM   Call this number if you have problems the morning of surgery [phone]   Do not eat food  :After Midnight.   May have liquids until 6:45 AM day of surgery   CLEAR LIQUID DIET  Foods Allowed                                                                     Foods Excluded  Water, Black Coffee and tea, regular and decaf                             liquids that you cannot  Plain Jell-O in any flavor  (No red)                                           see through such as: Fruit ices (not with fruit pulp)                                     milk, soups, orange juice  Iced Popsicles (No red)                                    All solid food Carbonated beverages, regular and diet                                    Apple juices Sports drinks like Gatorade (No red) Lightly seasoned clear broth or consume(fat free) Sugar, honey syrup  Sample Menu Breakfast                                Lunch                                     Supper Cranberry juice                    Beef broth                            Chicken broth  Jell-O                                     Grape juice                           Apple juice Coffee or tea                        Jell-O                                      Popsicle                                                Coffee or tea                        Coffee  or tea   Complete one Ensure drink the morning of surgery at  6:45 AM the day of surgery.   Oral Hygiene is also important to reduce your risk of infection.                                    Remember - BRUSH YOUR TEETH THE MORNING OF SURGERY WITH YOUR REGULAR TOOTHPASTE   Do NOT smoke after Midnight   Take these medicines the morning of surgery with A SIP OF WATER: Cetirizine, Omeprazole,  Oxycodone if needed   May use nasal spray if needed                               You may not have any metal on your body including jewelry, and body piercings             Do not wear lotions, powders, perfumes/cologne, or deodorant                        Men may shave face and neck.   Do not bring valuables to the hospital. [facility] IS NOT             RESPONSIBLE   FOR VALUABLES.   Contacts, dentures or bridgework may not be worn into surgery.   Bring small overnight bag day of surgery.    Patients discharged the day of surgery will not be allowed to drive home.   Special Instructions: Bring a copy of your healthcare power of attorney and living will documents         the day of surgery if you haven't scanned them in before.              Please read over the following fact sheets you were given: IF YOU HAVE QUESTIONS ABOUT YOUR PRE OP INSTRUCTIONS PLEASE CALL [phone]  [place]- Preparing for Total Shoulder Arthroplasty    Before surgery, you can play an important role. Because skin is not sterile, your skin needs to be as free of germs as possible. You can reduce the number of germs on your skin by  using the following products. . Benzoyl Peroxide Gel o Reduces the number of germs present on the skin o Applied twice a day to shoulder area starting two days before surgery    ==================================================================  Please follow these instructions carefully:  BENZOYL PEROXIDE 5% GEL  Please do not use if you have an allergy to benzoyl peroxide.   If  your skin becomes reddened/irritated stop using the benzoyl peroxide.  Starting two days before surgery, apply as follows: (Tuesday and Wednesday) 1. Apply benzoyl peroxide in the morning and at night. Apply after taking a shower. If you are not taking a shower clean entire shoulder front, back, and side along with the armpit with a clean wet washcloth.  2. Place a quarter-sized dollop on your shoulder and rub in thoroughly, making sure to cover the front, back, and side of your shoulder, along with the armpit.   2 days before ____ AM   ____ PM              1 day before ____ AM   ____ PM                         3. Do this twice a day for two days.  (Last application is the night before surgery, AFTER using the CHG soap as described below).  4. Do NOT apply benzoyl peroxide gel on the day of surgery.   [place] - Preparing for Surgery Before surgery, you can play an important role.  Because skin is not sterile, your skin needs to be as free of germs as possible.  You can reduce the number of germs on your skin by washing with CHG (chlorahexidine gluconate) soap before surgery.  CHG is an antiseptic cleaner which kills germs and bonds with the skin to continue killing germs even after washing. Please DO NOT use if you have an allergy to CHG or antibacterial soaps.  If your skin becomes reddened/irritated stop using the CHG and inform your nurse when you arrive at Short Stay. Do not shave (including legs and underarms) for at least 48 hours prior to the first CHG shower.  You may shave your face/neck.  Please follow these instructions carefully:  1.  Shower with CHG Soap the night before surgery and the  morning of surgery.  2.  If you choose to wash your hair, wash your hair first as usual with your normal  shampoo.  3.  After you shampoo, rinse your hair and body thoroughly to remove the shampoo.                             4.  Use CHG as you would any other liquid soap.  You can apply chg  directly to the skin and wash.  Gently with a scrungie or clean washcloth.  5.  Apply the CHG Soap to your body ONLY FROM THE NECK DOWN.   Do   not use on face/ open                           Wound or open sores. Avoid contact with eyes, ears mouth and   genitals (private parts).                       Wash face,  Genitals (private parts) with your normal soap.  6.  Wash thoroughly, paying special attention to the area where your    surgery  will be performed.  7.  Thoroughly rinse your body with warm water from the neck down.  8.  DO NOT shower/wash with your normal soap after using and rinsing off the CHG Soap.                9.  Pat yourself dry with a clean towel.            10.  Wear clean pajamas.            11.  Place clean sheets on your bed the night of your first shower and do not  sleep with pets. Day of Surgery : Do not apply any lotions/deodorants the morning of surgery.  Please wear clean clothes to the hospital/surgery center.  FAILURE TO FOLLOW THESE INSTRUCTIONS MAY RESULT IN THE CANCELLATION OF YOUR SURGERY  PATIENT SIGNATURE_________________________________  NURSE SIGNATURE__________________________________  ________________________________________________________________________   Adam Phenix  An incentive spirometer is a tool that can help keep your lungs clear and active. This tool measures how well you are filling your lungs with each breath. Taking long deep breaths may help reverse or decrease the chance of developing breathing (pulmonary) problems (especially infection) following:  A long period of time when you are unable to move or be active. BEFORE THE PROCEDURE   If the spirometer includes an indicator to show your best effort, your nurse or respiratory therapist will set it to a desired goal.  If possible, sit up straight or lean slightly forward. Try not to slouch.  Hold the incentive spirometer in an upright position. INSTRUCTIONS  FOR USE  1. Sit on the edge of your bed if possible, or sit up as far as you can in bed or on a chair. 2. Hold the incentive spirometer in an upright position. 3. Breathe out normally. 4. Place the mouthpiece in your mouth and seal your lips tightly around it. 5. Breathe in slowly and as deeply as possible, raising the piston or the ball toward the top of the column. 6. Hold your breath for 3-5 seconds or for as long as possible. Allow the piston or ball to fall to the bottom of the column. 7. Remove the mouthpiece from your mouth and breathe out normally. 8. Rest for a few seconds and repeat Steps 1 through 7 at least 10 times every 1-2 hours when you are awake. Take your time and take a few normal breaths between deep breaths. 9. The spirometer may include an indicator to show your best effort. Use the indicator as a goal to work toward during each repetition. 10. After each set of 10 deep breaths, practice coughing to be sure your lungs are clear. If you have an incision (the cut made at the time of surgery), support your incision when coughing by placing a pillow or rolled up towels firmly against it. Once you are able to get out of bed, walk around indoors and cough well. You may stop using the incentive spirometer when instructed by your caregiver.  RISKS AND COMPLICATIONS  Take your time so you do not get dizzy or light-headed.  If you are in pain, you may need to take or ask for pain medication before doing incentive spirometry. It is harder to take a deep breath if you are having pain. AFTER USE  Rest and breathe slowly and easily.  It can be helpful to keep track of a  log of your progress. Your caregiver can provide you with a simple table to help with this. If you are using the spirometer at home, follow these instructions: SEEK MEDICAL CARE IF:   You are having difficultly using the spirometer.  You have trouble using the spirometer as often as instructed.  Your pain  medication is not giving enough relief while using the spirometer.  You develop fever of 100.5 F (38.1 C) or higher. SEEK IMMEDIATE MEDICAL CARE IF:   You cough up bloody sputum that had not been present before.  You develop fever of 102 F (38.9 C) or greater.  You develop worsening pain at or near the incision site. MAKE SURE YOU:   Understand these instructions.  Will watch your condition.  Will get help right away if you are not doing well or get worse. Document Released: 02/12/2007 Document Revised: 12/25/2011 Document Reviewed: 04/15/2007 ExitCare Patient Information 2014 ExitCare, [place].   ________________________________________________________________________  WHAT IS A BLOOD TRANSFUSION? Blood Transfusion Information  A transfusion is the replacement of blood or some of its parts. Blood is made up of multiple cells which provide different functions.  Red blood cells carry oxygen and are used for blood loss replacement.  White blood cells fight against infection.  Platelets control bleeding.  Plasma helps clot blood.  Other blood products are available for specialized needs, such as hemophilia or other clotting disorders. BEFORE THE TRANSFUSION  Who gives blood for transfusions?   Healthy volunteers who are fully evaluated to make sure their blood is safe. This is blood bank blood. Transfusion therapy is the safest it has ever been in the practice of medicine. Before blood is taken from a donor, a complete history is taken to make sure that person has no history of diseases nor engages in risky social behavior (examples are intravenous drug use or sexual activity with multiple partners). The donor's travel history is screened to minimize risk of transmitting infections, such as malaria. The donated blood is tested for signs of infectious diseases, such as HIV and hepatitis. The blood is then tested to be sure it is compatible with you in order to minimize the  chance of a transfusion reaction. If you or a relative donates blood, this is often done in anticipation of surgery and is not appropriate for emergency situations. It takes many days to process the donated blood. RISKS AND COMPLICATIONS Although transfusion therapy is very safe and saves many lives, the main dangers of transfusion include:   Getting an infectious disease.  Developing a transfusion reaction. This is an allergic reaction to something in the blood you were given. Every precaution is taken to prevent this. The decision to have a blood transfusion has been considered carefully by your caregiver before blood is given. Blood is not given unless the benefits outweigh the risks. AFTER THE TRANSFUSION  Right after receiving a blood transfusion, you will usually feel much better and more energetic. This is especially true if your red blood cells have gotten low (anemic). The transfusion raises the level of the red blood cells which carry oxygen, and this usually causes an energy increase.  The nurse administering the transfusion will monitor you carefully for complications. HOME CARE INSTRUCTIONS  No special instructions are needed after a transfusion. You may find your energy is better. Speak with your caregiver about any limitations on activity for underlying diseases you may have. SEEK MEDICAL CARE IF:   Your condition is not improving after your transfusion.  You  develop redness or irritation at the intravenous (IV) site. SEEK IMMEDIATE MEDICAL CARE IF:  Any of the following symptoms occur over the next 12 hours:  Shaking chills.  You have a temperature by mouth above 102 F (38.9 C), not controlled by medicine.  Chest, back, or muscle pain.  People around you feel you are not acting correctly or are confused.  Shortness of breath or difficulty breathing.  Dizziness and fainting.  You get a rash or develop hives.  You have a decrease in urine output.  Your urine  turns a dark color or changes to pink, red, or brown. Any of the following symptoms occur over the next 10 days:  You have a temperature by mouth above 102 F (38.9 C), not controlled by medicine.  Shortness of breath.  Weakness after normal activity.  The white part of the eye turns yellow (jaundice).  You have a decrease in the amount of urine or are urinating less often.  Your urine turns a dark color or changes to pink, red, or brown. Document Released: 09/29/2000 Document Revised: 12/25/2011 Document Reviewed: 05/18/2008 [facility] Patient Information 2014 [place], [place].  _______________________________________________________________________

## 2020-01-27 ENCOUNTER — Encounter (HOSPITAL_COMMUNITY): Payer: Self-pay

## 2020-01-27 ENCOUNTER — Other Ambulatory Visit: Payer: Self-pay

## 2020-01-27 ENCOUNTER — Encounter (HOSPITAL_COMMUNITY)
Admission: RE | Admit: 2020-01-27 | Discharge: 2020-01-27 | Disposition: A | Discharge status: Home / Self Care | Payer: MEDICARE | Source: Ambulatory Visit | Attending: Orthopedic Surgery | Admitting: Orthopedic Surgery

## 2020-01-27 HISTORY — DX: Pneumonia, unspecified organism: J18.9

## 2020-01-27 HISTORY — DX: Congenital hypertrophic pyloric stenosis: Q40.0

## 2020-01-27 HISTORY — DX: Syncope and collapse: R55

## 2020-01-27 NOTE — Progress Notes (Signed)
Has completed COVID 19 vaccine series PCP - A. Wynonia Musty. [facility], [place] Cardiologist - Dr. Judie Petit. [place]  Chest x-ray - 01/01/20 epic EKG - 01/01/20 epic Stress Test - greater than 2 years ECHO - greater than 2 years Cardiac Cath - 2020 Dr. Natasha Bence  Sleep Study - N/A CPAP -N/A   Fasting Blood Sugar - N/A Checks Blood Sugar __N/A___ times a day  Blood Thinner Instructions: N/A Aspirin Instructions: Yes Last Dose: 02/02/20  Anesthesia review: TAVR 06/2019 (Dr. Natasha Bence)  Patient denies shortness of breath, fever, cough and chest pain at PAT appointment   Patient verbalized understanding of instructions that were given to them at the PAT appointment. Patient was also instructed that they will need to review over the PAT instructions again at home before surgery.

## 2020-01-29 ENCOUNTER — Encounter (HOSPITAL_COMMUNITY): Payer: MEDICARE

## 2020-02-02 ENCOUNTER — Other Ambulatory Visit (HOSPITAL_COMMUNITY)
Admission: RE | Admit: 2020-02-02 | Discharge: 2020-02-02 | Disposition: A | Discharge status: Home / Self Care | Payer: MEDICARE | Source: Ambulatory Visit | Attending: Orthopedic Surgery | Admitting: Orthopedic Surgery

## 2020-02-02 ENCOUNTER — Other Ambulatory Visit: Payer: Self-pay

## 2020-02-02 ENCOUNTER — Encounter (HOSPITAL_COMMUNITY)
Admission: RE | Admit: 2020-02-02 | Discharge: 2020-02-02 | Disposition: A | Discharge status: Home / Self Care | Payer: MEDICARE | Source: Ambulatory Visit | Attending: Orthopedic Surgery | Admitting: Orthopedic Surgery

## 2020-02-02 DIAGNOSIS — Z01818 Encounter for other preprocedural examination: Secondary | ICD-10-CM | POA: Insufficient documentation

## 2020-02-02 DIAGNOSIS — Z20822 Contact with and (suspected) exposure to covid-19: Secondary | ICD-10-CM | POA: Diagnosis not present

## 2020-02-02 DIAGNOSIS — Z01811 Encounter for preprocedural respiratory examination: Secondary | ICD-10-CM

## 2020-02-02 LAB — CBC WITH DIFFERENTIAL/PLATELET
Abs Immature Granulocytes: 0.01 10*3/uL (ref 0.00–0.07)
Basophils Absolute: 0 10*3/uL (ref 0.0–0.1)
Basophils Relative: 0 %
Eosinophils Absolute: 0.1 10*3/uL (ref 0.0–0.5)
Eosinophils Relative: 3 %
HCT: 45.4 % (ref 39.0–52.0)
Hemoglobin: 15.1 g/dL (ref 13.0–17.0)
Immature Granulocytes: 0 %
Lymphocytes Relative: 28 %
Lymphs Abs: 1.3 10*3/uL (ref 0.7–4.0)
MCH: 29.5 pg (ref 26.0–34.0)
MCHC: 33.3 g/dL (ref 30.0–36.0)
MCV: 88.8 fL (ref 80.0–100.0)
Monocytes Absolute: 0.5 10*3/uL (ref 0.1–1.0)
Monocytes Relative: 10 %
Neutro Abs: 2.8 10*3/uL (ref 1.7–7.7)
Neutrophils Relative %: 59 %
Platelets: 144 10*3/uL — ABNORMAL LOW (ref 150–400)
RBC: 5.11 MIL/uL (ref 4.22–5.81)
RDW: 13.3 % (ref 11.5–15.5)
WBC: 4.7 10*3/uL (ref 4.0–10.5)
nRBC: 0 % (ref 0.0–0.2)

## 2020-02-02 LAB — URINALYSIS, ROUTINE W REFLEX MICROSCOPIC
Bilirubin Urine: NEGATIVE
Glucose, UA: NEGATIVE mg/dL
Hgb urine dipstick: NEGATIVE
Ketones, ur: NEGATIVE mg/dL
Leukocytes,Ua: NEGATIVE
Nitrite: NEGATIVE
Protein, ur: NEGATIVE mg/dL
Specific Gravity, Urine: 1.025 (ref 1.005–1.030)
pH: 5 (ref 5.0–8.0)

## 2020-02-02 LAB — COMPREHENSIVE METABOLIC PANEL
ALT: 26 U/L (ref 0–44)
AST: 29 U/L (ref 15–41)
Albumin: 4.2 g/dL (ref 3.5–5.0)
Alkaline Phosphatase: 71 U/L (ref 38–126)
Anion gap: 9 (ref 5–15)
BUN: 22 mg/dL (ref 8–23)
CO2: 27 mmol/L (ref 22–32)
Calcium: 9.1 mg/dL (ref 8.9–10.3)
Chloride: 104 mmol/L (ref 98–111)
Creatinine, Ser: 1.12 mg/dL (ref 0.61–1.24)
GFR calc Af Amer: >60 mL/min (ref >60)
GFR calc non Af Amer: >60 mL/min (ref >60)
Glucose, Bld: 101 mg/dL — ABNORMAL HIGH (ref 70–99)
Potassium: 4.1 mmol/L (ref 3.5–5.1)
Sodium: 140 mmol/L (ref 135–145)
Total Bilirubin: 0.8 mg/dL (ref 0.3–1.2)
Total Protein: 6.6 g/dL (ref 6.5–8.1)

## 2020-02-02 LAB — PROTIME-INR
INR: 1 (ref 0.8–1.2)
Prothrombin Time: 13.3 seconds (ref 11.4–15.2)

## 2020-02-02 LAB — SURGICAL PCR SCREEN
MRSA, PCR: NEGATIVE
Staphylococcus aureus: NEGATIVE

## 2020-02-02 LAB — APTT: aPTT: 30 seconds (ref 24–36)

## 2020-02-02 LAB — SARS CORONAVIRUS 2 (TAT 6-24 HRS): SARS Coronavirus 2: NEGATIVE

## 2020-02-04 NOTE — Progress Notes (Signed)
I received a message that Brent Jordan called with questions about pre op instructions. I left a voicemail on his home number and cell phone. I also gave him short stay's phone number [phone] since I will be leaving for the day.

## 2020-02-05 ENCOUNTER — Other Ambulatory Visit: Payer: Self-pay

## 2020-02-05 ENCOUNTER — Ambulatory Visit (HOSPITAL_COMMUNITY): Payer: MEDICARE

## 2020-02-05 ENCOUNTER — Ambulatory Visit (HOSPITAL_COMMUNITY): Payer: MEDICARE | Admitting: Anesthesiology

## 2020-02-05 ENCOUNTER — Observation Stay (HOSPITAL_COMMUNITY)
Admission: RE | Admit: 2020-02-05 | Discharge: 2020-02-06 | Disposition: A | Discharge status: Home / Self Care | Payer: MEDICARE | Attending: Orthopedic Surgery | Admitting: Orthopedic Surgery

## 2020-02-05 ENCOUNTER — Ambulatory Visit (HOSPITAL_COMMUNITY): Payer: MEDICARE | Admitting: Physician Assistant

## 2020-02-05 ENCOUNTER — Encounter (HOSPITAL_COMMUNITY): Payer: Self-pay | Admitting: Orthopedic Surgery

## 2020-02-05 ENCOUNTER — Encounter (HOSPITAL_COMMUNITY)
Admission: RE | Disposition: A | Discharge status: Home / Self Care | Payer: Self-pay | Source: Home / Self Care | Attending: Orthopedic Surgery

## 2020-02-05 DIAGNOSIS — M75101 Unspecified rotator cuff tear or rupture of right shoulder, not specified as traumatic: Principal | ICD-10-CM | POA: Insufficient documentation

## 2020-02-05 DIAGNOSIS — R011 Cardiac murmur, unspecified: Secondary | ICD-10-CM | POA: Diagnosis not present

## 2020-02-05 DIAGNOSIS — Z833 Family history of diabetes mellitus: Secondary | ICD-10-CM | POA: Diagnosis not present

## 2020-02-05 DIAGNOSIS — Z8249 Family history of ischemic heart disease and other diseases of the circulatory system: Secondary | ICD-10-CM | POA: Diagnosis not present

## 2020-02-05 DIAGNOSIS — Z7982 Long term (current) use of aspirin: Secondary | ICD-10-CM | POA: Diagnosis not present

## 2020-02-05 DIAGNOSIS — I10 Essential (primary) hypertension: Secondary | ICD-10-CM | POA: Insufficient documentation

## 2020-02-05 DIAGNOSIS — Z96611 Presence of right artificial shoulder joint: Secondary | ICD-10-CM

## 2020-02-05 DIAGNOSIS — Z9049 Acquired absence of other specified parts of digestive tract: Secondary | ICD-10-CM | POA: Insufficient documentation

## 2020-02-05 DIAGNOSIS — K219 Gastro-esophageal reflux disease without esophagitis: Secondary | ICD-10-CM | POA: Insufficient documentation

## 2020-02-05 DIAGNOSIS — D649 Anemia, unspecified: Secondary | ICD-10-CM | POA: Insufficient documentation

## 2020-02-05 DIAGNOSIS — Z823 Family history of stroke: Secondary | ICD-10-CM | POA: Diagnosis not present

## 2020-02-05 DIAGNOSIS — E785 Hyperlipidemia, unspecified: Secondary | ICD-10-CM | POA: Diagnosis not present

## 2020-02-05 DIAGNOSIS — Z79899 Other long term (current) drug therapy: Secondary | ICD-10-CM | POA: Diagnosis not present

## 2020-02-05 DIAGNOSIS — M19011 Primary osteoarthritis, right shoulder: Secondary | ICD-10-CM | POA: Diagnosis not present

## 2020-02-05 DIAGNOSIS — Z9889 Other specified postprocedural states: Secondary | ICD-10-CM | POA: Insufficient documentation

## 2020-02-05 DIAGNOSIS — N400 Enlarged prostate without lower urinary tract symptoms: Secondary | ICD-10-CM | POA: Insufficient documentation

## 2020-02-05 DIAGNOSIS — F419 Anxiety disorder, unspecified: Secondary | ICD-10-CM | POA: Insufficient documentation

## 2020-02-05 HISTORY — PX: TOTAL SHOULDER ARTHROPLASTY: SHX126

## 2020-02-05 LAB — TYPE AND SCREEN
ABO/RH(D): O POS
Antibody Screen: NEGATIVE

## 2020-02-05 SURGERY — ARTHROPLASTY, SHOULDER, TOTAL
Anesthesia: General | Site: Shoulder | Laterality: Right

## 2020-02-05 MED ORDER — ONDANSETRON HCL 4 MG/2ML IJ SOLN
INTRAMUSCULAR | Status: AC
  Filled 2020-02-05: qty 2

## 2020-02-05 MED ORDER — MORPHINE SULFATE (PF) 2 MG/ML IV SOLN: 0.5000 mg | INTRAVENOUS | Status: DC | PRN

## 2020-02-05 MED ORDER — METHOCARBAMOL 1000 MG/10ML IJ SOLN
500.0000 mg | Freq: Four times a day (QID) | INTRAVENOUS | Status: DC | PRN
  Filled 2020-02-05: qty 5

## 2020-02-05 MED ORDER — METOCLOPRAMIDE HCL 5 MG PO TABS: 5.0000 mg | ORAL_TABLET | Freq: Three times a day (TID) | ORAL | Status: DC | PRN

## 2020-02-05 MED ORDER — ATORVASTATIN CALCIUM 10 MG PO TABS
10.0000 mg | ORAL_TABLET | Freq: Every day | ORAL | Status: DC
  Administered 2020-02-05: 10 mg via ORAL
  Filled 2020-02-05: qty 1

## 2020-02-05 MED ORDER — SODIUM CHLORIDE 0.9 % IR SOLN
Status: DC | PRN
  Administered 2020-02-05: 1000 mL

## 2020-02-05 MED ORDER — POLYETHYLENE GLYCOL 3350 17 G PO PACK: 17.0000 g | PACK | Freq: Every day | ORAL | Status: DC | PRN

## 2020-02-05 MED ORDER — STERILE WATER FOR IRRIGATION IR SOLN
Status: DC | PRN
  Administered 2020-02-05 (×2): 1000 mL

## 2020-02-05 MED ORDER — PANTOPRAZOLE SODIUM 40 MG PO TBEC
80.0000 mg | DELAYED_RELEASE_TABLET | Freq: Every day | ORAL | Status: DC
  Administered 2020-02-06: 08:00:00 80 mg via ORAL
  Filled 2020-02-05: qty 2

## 2020-02-05 MED ORDER — ONDANSETRON HCL 4 MG/2ML IJ SOLN: 4.0000 mg | Freq: Four times a day (QID) | INTRAMUSCULAR | Status: DC | PRN

## 2020-02-05 MED ORDER — HYDROCODONE-ACETAMINOPHEN 5-325 MG PO TABS
1.0000 | ORAL_TABLET | ORAL | Status: DC | PRN
  Administered 2020-02-05 – 2020-02-06 (×2): 2 via ORAL
  Filled 2020-02-05 (×2): qty 2

## 2020-02-05 MED ORDER — BUPIVACAINE-EPINEPHRINE (PF) 0.5% -1:200000 IJ SOLN
INTRAMUSCULAR | Status: DC | PRN
  Administered 2020-02-05: 20 mL via PERINEURAL

## 2020-02-05 MED ORDER — ROCURONIUM BROMIDE 10 MG/ML (PF) SYRINGE
PREFILLED_SYRINGE | INTRAVENOUS | Status: DC | PRN
  Administered 2020-02-05: 50 mg via INTRAVENOUS

## 2020-02-05 MED ORDER — CEFAZOLIN SODIUM-DEXTROSE 2-4 GM/100ML-% IV SOLN
2.0000 g | INTRAVENOUS | Status: AC
  Administered 2020-02-05: 2 g via INTRAVENOUS
  Filled 2020-02-05: qty 100

## 2020-02-05 MED ORDER — LACTATED RINGERS IV SOLN: INTRAVENOUS | Status: DC

## 2020-02-05 MED ORDER — FENTANYL CITRATE (PF) 100 MCG/2ML IJ SOLN
INTRAMUSCULAR | Status: AC
  Filled 2020-02-05: qty 2

## 2020-02-05 MED ORDER — ACETAMINOPHEN 500 MG PO TABS
500.0000 mg | ORAL_TABLET | Freq: Four times a day (QID) | ORAL | Status: DC
  Administered 2020-02-05 – 2020-02-06 (×3): 500 mg via ORAL
  Filled 2020-02-05 (×3): qty 1

## 2020-02-05 MED ORDER — METHOCARBAMOL 500 MG PO TABS: 500.0000 mg | ORAL_TABLET | Freq: Four times a day (QID) | ORAL | Status: DC | PRN

## 2020-02-05 MED ORDER — PROPOFOL 10 MG/ML IV BOLUS
INTRAVENOUS | Status: AC
  Filled 2020-02-05: qty 20

## 2020-02-05 MED ORDER — LIDOCAINE 2% (20 MG/ML) 5 ML SYRINGE
INTRAMUSCULAR | Status: DC | PRN
  Administered 2020-02-05: 50 mg via INTRAVENOUS

## 2020-02-05 MED ORDER — ONDANSETRON HCL 4 MG/2ML IJ SOLN
INTRAMUSCULAR | Status: DC | PRN
  Administered 2020-02-05: 4 mg via INTRAVENOUS

## 2020-02-05 MED ORDER — CEFAZOLIN SODIUM-DEXTROSE 1-4 GM/50ML-% IV SOLN
1.0000 g | Freq: Four times a day (QID) | INTRAVENOUS | Status: AC
  Administered 2020-02-05 – 2020-02-06 (×3): 1 g via INTRAVENOUS
  Filled 2020-02-05 (×3): qty 50

## 2020-02-05 MED ORDER — MIDAZOLAM HCL 2 MG/2ML IJ SOLN
1.0000 mg | INTRAMUSCULAR | Status: DC
  Administered 2020-02-05: 09:00:00 1 mg via INTRAVENOUS
  Filled 2020-02-05: qty 2

## 2020-02-05 MED ORDER — SUGAMMADEX SODIUM 200 MG/2ML IV SOLN
INTRAVENOUS | Status: DC | PRN
  Administered 2020-02-05: 200 mg via INTRAVENOUS

## 2020-02-05 MED ORDER — DEXAMETHASONE SODIUM PHOSPHATE 10 MG/ML IJ SOLN
INTRAMUSCULAR | Status: AC
  Filled 2020-02-05: qty 1

## 2020-02-05 MED ORDER — HYDROCODONE-ACETAMINOPHEN 7.5-325 MG PO TABS: 1.0000 | ORAL_TABLET | ORAL | Status: DC | PRN

## 2020-02-05 MED ORDER — FENTANYL CITRATE (PF) 100 MCG/2ML IJ SOLN
50.0000 ug | INTRAMUSCULAR | Status: DC
  Administered 2020-02-05: 09:00:00 50 ug via INTRAVENOUS
  Filled 2020-02-05: qty 2

## 2020-02-05 MED ORDER — METOCLOPRAMIDE HCL 5 MG/ML IJ SOLN: 5.0000 mg | Freq: Three times a day (TID) | INTRAMUSCULAR | Status: DC | PRN

## 2020-02-05 MED ORDER — DULOXETINE HCL 30 MG PO CPEP
30.0000 mg | ORAL_CAPSULE | Freq: Every day | ORAL | Status: DC
  Administered 2020-02-05: 30 mg via ORAL
  Filled 2020-02-05: qty 1

## 2020-02-05 MED ORDER — PHENYLEPHRINE 40 MCG/ML (10ML) SYRINGE FOR IV PUSH (FOR BLOOD PRESSURE SUPPORT)
PREFILLED_SYRINGE | INTRAVENOUS | Status: DC | PRN
  Administered 2020-02-05 (×2): 160 ug via INTRAVENOUS

## 2020-02-05 MED ORDER — TAMSULOSIN HCL 0.4 MG PO CAPS
0.4000 mg | ORAL_CAPSULE | Freq: Every day | ORAL | Status: DC
  Administered 2020-02-06: 08:00:00 0.4 mg via ORAL
  Filled 2020-02-05: qty 1

## 2020-02-05 MED ORDER — ZOLPIDEM TARTRATE 5 MG PO TABS: 5.0000 mg | ORAL_TABLET | Freq: Every evening | ORAL | Status: DC | PRN

## 2020-02-05 MED ORDER — ROCURONIUM BROMIDE 10 MG/ML (PF) SYRINGE
PREFILLED_SYRINGE | INTRAVENOUS | Status: AC
  Filled 2020-02-05: qty 10

## 2020-02-05 MED ORDER — TRANEXAMIC ACID-NACL 1000-0.7 MG/100ML-% IV SOLN
1000.0000 mg | INTRAVENOUS | Status: AC
  Administered 2020-02-05: 1000 mg via INTRAVENOUS
  Filled 2020-02-05: qty 100

## 2020-02-05 MED ORDER — FLEET ENEMA 7-19 GM/118ML RE ENEM: 1.0000 | ENEMA | Freq: Once | RECTAL | Status: DC | PRN

## 2020-02-05 MED ORDER — DOCUSATE SODIUM 100 MG PO CAPS
100.0000 mg | ORAL_CAPSULE | Freq: Two times a day (BID) | ORAL | Status: DC
  Administered 2020-02-05 – 2020-02-06 (×2): 100 mg via ORAL
  Filled 2020-02-05 (×2): qty 1

## 2020-02-05 MED ORDER — MENTHOL 3 MG MT LOZG: 1.0000 | LOZENGE | OROMUCOSAL | Status: DC | PRN

## 2020-02-05 MED ORDER — DIPHENHYDRAMINE HCL 12.5 MG/5ML PO ELIX
12.5000 mg | ORAL_SOLUTION | ORAL | Status: DC | PRN
  Administered 2020-02-06: 01:00:00 25 mg via ORAL
  Filled 2020-02-05: qty 10

## 2020-02-05 MED ORDER — PROPOFOL 10 MG/ML IV BOLUS
INTRAVENOUS | Status: DC | PRN
  Administered 2020-02-05: 120 mg via INTRAVENOUS

## 2020-02-05 MED ORDER — PHENYLEPHRINE HCL-NACL 10-0.9 MG/250ML-% IV SOLN
INTRAVENOUS | Status: DC | PRN
  Administered 2020-02-05: 50 ug/min via INTRAVENOUS

## 2020-02-05 MED ORDER — ONDANSETRON HCL 4 MG PO TABS: 4.0000 mg | ORAL_TABLET | Freq: Four times a day (QID) | ORAL | Status: DC | PRN

## 2020-02-05 MED ORDER — FENTANYL CITRATE (PF) 100 MCG/2ML IJ SOLN
INTRAMUSCULAR | Status: DC | PRN
  Administered 2020-02-05: 50 ug via INTRAVENOUS

## 2020-02-05 MED ORDER — ACETAMINOPHEN 325 MG PO TABS: 325.0000 mg | ORAL_TABLET | Freq: Four times a day (QID) | ORAL | Status: DC | PRN

## 2020-02-05 MED ORDER — PHENYLEPHRINE HCL (PRESSORS) 10 MG/ML IV SOLN
INTRAVENOUS | Status: AC
  Filled 2020-02-05: qty 1

## 2020-02-05 MED ORDER — LIDOCAINE 2% (20 MG/ML) 5 ML SYRINGE
INTRAMUSCULAR | Status: AC
  Filled 2020-02-05: qty 5

## 2020-02-05 MED ORDER — ASPIRIN EC 81 MG PO TBEC
81.0000 mg | DELAYED_RELEASE_TABLET | Freq: Two times a day (BID) | ORAL | Status: DC
  Administered 2020-02-06: 81 mg via ORAL
  Filled 2020-02-05: qty 1

## 2020-02-05 MED ORDER — DEXAMETHASONE SODIUM PHOSPHATE 10 MG/ML IJ SOLN
INTRAMUSCULAR | Status: DC | PRN
  Administered 2020-02-05: 8 mg via INTRAVENOUS

## 2020-02-05 MED ORDER — POTASSIUM CHLORIDE IN NACL 20-0.45 MEQ/L-% IV SOLN
INTRAVENOUS | Status: DC
  Filled 2020-02-05 (×3): qty 1000

## 2020-02-05 MED ORDER — ALUM & MAG HYDROXIDE-SIMETH 200-200-20 MG/5ML PO SUSP: 30.0000 mL | ORAL | Status: DC | PRN

## 2020-02-05 MED ORDER — BISACODYL 5 MG PO TBEC: 5.0000 mg | DELAYED_RELEASE_TABLET | Freq: Every day | ORAL | Status: DC | PRN

## 2020-02-05 MED ORDER — BUPIVACAINE LIPOSOME 1.3 % IJ SUSP
INTRAMUSCULAR | Status: DC | PRN
  Administered 2020-02-05: 10 mL via PERINEURAL

## 2020-02-05 MED ORDER — MIRTAZAPINE 15 MG PO TABS
15.0000 mg | ORAL_TABLET | Freq: Every day | ORAL | Status: DC
  Administered 2020-02-05: 20:00:00 15 mg via ORAL
  Filled 2020-02-05: qty 1

## 2020-02-05 MED ORDER — PHENOL 1.4 % MT LIQD: 1.0000 | OROMUCOSAL | Status: DC | PRN

## 2020-02-05 MED ORDER — MONTELUKAST SODIUM 10 MG PO TABS
10.0000 mg | ORAL_TABLET | Freq: Every day | ORAL | Status: DC
  Administered 2020-02-05: 20:00:00 10 mg via ORAL
  Filled 2020-02-05: qty 1

## 2020-02-05 SURGICAL SUPPLY — 77 items
BAG ZIPLOCK 12X15 (MISCELLANEOUS) ×3 IMPLANT
BASEPLATE P2 COATD GLND 6.5X30 (Shoulder) ×1 IMPLANT
BIT DRILL 1.6MX128 (BIT) IMPLANT
BIT DRILL 1.6MX128MM (BIT)
BIT DRILL 2.5 DIA 127 CALI (BIT) ×3 IMPLANT
BIT DRILL 4 DIA CALIBRATED (BIT) ×3 IMPLANT
BLADE SAW SAG 73X25 THK (BLADE) ×2
BLADE SAW SGTL 18X1.27X75 (BLADE) IMPLANT
BLADE SAW SGTL 18X1.27X75MM (BLADE)
BLADE SAW SGTL 73X25 THK (BLADE) ×1 IMPLANT
CHLORAPREP W/TINT 26 (MISCELLANEOUS) IMPLANT
CLOSURE WOUND 1/2 X4 (GAUZE/BANDAGES/DRESSINGS) ×1
COOLER ICEMAN CLASSIC (MISCELLANEOUS) ×3 IMPLANT
COVER BACK TABLE 60X90IN (DRAPES) ×3 IMPLANT
COVER SURGICAL LIGHT HANDLE (MISCELLANEOUS) ×3 IMPLANT
COVER WAND RF STERILE (DRAPES) IMPLANT
DRAPE INCISE IOBAN 66X45 STRL (DRAPES) ×3 IMPLANT
DRAPE ORTHO SPLIT 77X108 STRL (DRAPES)
DRAPE POUCH INSTRU U-SHP 10X18 (DRAPES) ×3 IMPLANT
DRAPE SHEET LG 3/4 BI-LAMINATE (DRAPES) ×6 IMPLANT
DRAPE SURG 17X11 SM STRL (DRAPES) ×3 IMPLANT
DRAPE SURG ORHT 6 SPLT 77X108 (DRAPES) IMPLANT
DRAPE U-SHAPE 47X51 STRL (DRAPES) ×3 IMPLANT
DRSG AQUACEL AG ADV 3.5X 6 (GAUZE/BANDAGES/DRESSINGS) ×3 IMPLANT
ELECT BLADE TIP CTD 4 INCH (ELECTRODE) ×3 IMPLANT
ELECT REM PT RETURN 15FT ADLT (MISCELLANEOUS) ×3 IMPLANT
GLOVE BIO SURGEON STRL SZ7 (GLOVE) ×3 IMPLANT
GLOVE BIO SURGEON STRL SZ7.5 (GLOVE) ×3 IMPLANT
GLOVE BIOGEL PI IND STRL 7.0 (GLOVE) ×1 IMPLANT
GLOVE BIOGEL PI IND STRL 8 (GLOVE) ×1 IMPLANT
GLOVE BIOGEL PI INDICATOR 7.0 (GLOVE) ×2
GLOVE BIOGEL PI INDICATOR 8 (GLOVE) ×2
GOWN STRL REUS W/TWL LRG LVL3 (GOWN DISPOSABLE) ×6 IMPLANT
GOWN STRL REUS W/TWL XL LVL3 (GOWN DISPOSABLE) ×3 IMPLANT
HANDPIECE INTERPULSE COAX TIP (DISPOSABLE) ×2
HEMOSTAT SURGICEL 2X14 (HEMOSTASIS) IMPLANT
HOOD PEEL AWAY FLYTE STAYCOOL (MISCELLANEOUS) ×9 IMPLANT
HUMERA STEM SM SHELL SHOU 10 (Miscellaneous) ×3 IMPLANT
INSERT SMALL SOCKET 32MM NEU (Insert) ×3 IMPLANT
KIT BASIN (CUSTOM PROCEDURE TRAY) ×3 IMPLANT
KIT TURNOVER KIT A (KITS) IMPLANT
MANIFOLD NEPTUNE II (INSTRUMENTS) ×3 IMPLANT
NEEDLE TROCAR POINT SZ 2 1/2 (NEEDLE) ×3 IMPLANT
NS IRRIG 1000ML POUR BTL (IV SOLUTION) ×3 IMPLANT
P2 COATDE GLNOID BSEPLT 6.5X30 (Shoulder) ×3 IMPLANT
PACK SHOULDER (CUSTOM PROCEDURE TRAY) ×3 IMPLANT
PAD COLD SHLDR WRAP-ON (PAD) ×3 IMPLANT
PROTECTOR NERVE ULNAR (MISCELLANEOUS) ×3 IMPLANT
RESTRAINT HEAD UNIVERSAL NS (MISCELLANEOUS) ×3 IMPLANT
RETRIEVER SUT HEWSON (MISCELLANEOUS) IMPLANT
SCREW BONE LOCKING RSP 5.0X30 (Screw) ×6 IMPLANT
SCREW BONE RSP LOCK 5X18 (Screw) ×1 IMPLANT
SCREW BONE RSP LOCK 5X26 (Screw) ×1 IMPLANT
SCREW BONE RSP LOCK 5X30 (Screw) ×2 IMPLANT
SCREW BONE RSP LOCKING 18MM LG (Screw) ×3 IMPLANT
SCREW BONE RSP LOCKING 5.0X26 (Screw) ×3 IMPLANT
SCREW RETAIN W/HEAD 32MM (Shoulder) ×3 IMPLANT
SET HNDPC FAN SPRY TIP SCT (DISPOSABLE) ×1 IMPLANT
SLING ARM FOAM STRAP LRG (SOFTGOODS) ×3 IMPLANT
SLING ARM IMMOBILIZER LRG (SOFTGOODS) ×3 IMPLANT
SMARTMIX MINI TOWER (MISCELLANEOUS)
SPONGE LAP 18X18 RF (DISPOSABLE) ×3 IMPLANT
STEM HUMERAL SM SHELL SHOU 10 (Miscellaneous) ×1 IMPLANT
STRIP CLOSURE SKIN 1/2X4 (GAUZE/BANDAGES/DRESSINGS) ×2 IMPLANT
SUCTION FRAZIER HANDLE 12FR (TUBING) ×2
SUCTION TUBE FRAZIER 12FR DISP (TUBING) ×1 IMPLANT
SUPPORT WRAP ARM LG (MISCELLANEOUS) ×3 IMPLANT
SUT ETHIBOND 2 V 37 (SUTURE) ×3 IMPLANT
SUT MNCRL AB 4-0 PS2 18 (SUTURE) ×3 IMPLANT
SUT VIC AB 2-0 CT1 27 (SUTURE) ×2
SUT VIC AB 2-0 CT1 TAPERPNT 27 (SUTURE) ×1 IMPLANT
TAPE LABRALWHITE 1.5X36 (TAPE) IMPLANT
TAPE SUT LABRALTAP WHT/BLK (SUTURE) IMPLANT
TOWEL OR 17X26 10 PK STRL BLUE (TOWEL DISPOSABLE) ×3 IMPLANT
TOWER SMARTMIX MINI (MISCELLANEOUS) IMPLANT
WATER STERILE IRR 1000ML POUR (IV SOLUTION) ×3 IMPLANT
YANKAUER SUCT BULB TIP 10FT TU (MISCELLANEOUS) ×3 IMPLANT

## 2020-02-05 NOTE — Anesthesia Preprocedure Evaluation (Addendum)
Anesthesia Evaluation  Patient identified by MRN, date of birth, ID band Patient awake    Reviewed: Allergy & Precautions, NPO status , Patient's Chart, lab work & pertinent test results  Airway Mallampati: II  TM Distance: >3 FB Neck ROM: Full    Dental no notable dental hx. (+) Teeth Intact, Caps   Pulmonary pneumonia, resolved,  Remote Hx/o Pneumonia   Pulmonary exam normal breath sounds clear to auscultation       Cardiovascular hypertension, Normal cardiovascular exam+ Valvular Problems/Murmurs AS  Rhythm:Regular Rate:Normal  Hx/o AS S/P TAVR   Neuro/Psych Anxiety negative neurological ROS     GI/Hepatic Neg liver ROS, GERD  Medicated and Controlled,  Endo/Other  Hyperlipidemia Low testosterone  Renal/GU negative Renal ROS   BPH    Musculoskeletal  (+) Arthritis , Osteoarthritis,  Rotator Cuff Arthropathy   Abdominal   Peds  Hematology  (+) anemia ,   Anesthesia Other Findings   Reproductive/Obstetrics                             Anesthesia Physical Anesthesia Plan  ASA: III  Anesthesia Plan: General   Post-op Pain Management:  Regional for Post-op pain   Induction: Intravenous  PONV Risk Score and Plan: 3 and Ondansetron, Dexamethasone and Treatment may vary due to age or medical condition  Airway Management Planned: Oral ETT  Additional Equipment:   Intra-op Plan:   Post-operative Plan: Extubation in OR  Informed Consent: I have reviewed the patients History and Physical, chart, labs and discussed the procedure including the risks, benefits and alternatives for the proposed anesthesia with the patient or authorized representative who has indicated his/her understanding and acceptance.     Dental advisory given  Plan Discussed with: CRNA and Surgeon  Anesthesia Plan Comments:         Anesthesia Quick Evaluation

## 2020-02-05 NOTE — H&P (Signed)
Brent Jordan is an 74 y.o. male.   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  Past Medical History:  Diagnosis Date  . Anemia   . Aortic valve stenosis    s/p TAVR  . GERD (gastroesophageal reflux disease)   . Heart murmur   . Hyperlipidemia   . Hypertension    No medications  . 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 Pneumonia    4th grade and 1970  . 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 Syncope    at [employer]    Past Surgical History:  Procedure Laterality Date  . 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 BLEPHAROPLASTY  2017  . CHOLECYSTECTOMY    . COLONOSCOPY    . EYE SURGERY     x2  . TONSILLECTOMY    . UPPER GASTROINTESTINAL ENDOSCOPY      Family History  Problem Relation Age of Onset  . Stroke Mother   . Heart disease Father   . Diabetes Father   . Heart disease Brother   . Diabetes Brother   . Colon cancer Neg Hx   . Stomach cancer Neg Hx    Social History:  reports that he has never smoked. He has never used smokeless tobacco. He reports current alcohol use of about 2.0 standard drinks of alcohol per week. He reports that he does not use drugs.  Allergies:  Allergies  Allergen Reactions  . Levaquin [Levofloxacin In D5w] Other (See Comments)    Bell palsy    Medications Prior to Admission  Medication Sig Dispense Refill  . aspirin EC 81 MG tablet Take 81 mg by mouth at bedtime.    Marland Kitchen atorvastatin (LIPITOR) 10 MG tablet Take 10 mg by mouth at bedtime.    . cetirizine (ZYRTEC) 10 MG tablet Take 10 mg by mouth daily.    . Cholecalciferol (VITAMIN D3) 50 MCG (2000 UT) TABS Take 2,000 Units by mouth daily.    . DULoxetine (CYMBALTA) 30 MG capsule Take 30 mg by mouth at bedtime.    . magnesium oxide (MAG-OX) 400 MG tablet Take 400 mg by mouth at bedtime.    .  mirtazapine (REMERON) 15 MG tablet Take 15 mg by mouth at bedtime.    . montelukast (SINGULAIR) 10 MG tablet Take 10 mg by mouth at bedtime.    Marland Kitchen omeprazole (PRILOSEC) 40 MG capsule Take 40 mg by mouth daily before breakfast.    . oxyCODONE-acetaminophen (PERCOCET/ROXICET) 5-325 MG tablet Take 1 tablet by mouth See admin instructions. Take 1 tablet by mouth scheduled every morning, may take an additional tablet if needed for pain.    . tamsulosin (FLOMAX) 0.4 MG CAPS capsule Take 0.4 mg by mouth in the morning and at bedtime.     . triamcinolone (NASACORT ALLERGY 24HR) 55 MCG/ACT AERO nasal inhaler Place 2 sprays into the nose daily as needed (allergies.).     Marland Kitchen vitamin E 400 UNIT capsule Take 400 Units by mouth daily.    Marland Kitchen acetaminophen (TYLENOL) 500 MG tablet Take 500-1,000 mg by mouth every 6 (six) hours as needed (for pain.).    Marland Kitchen diclofenac (VOLTAREN) 75 MG EC tablet Take 75 mg by mouth 2 (two) times daily as needed (pain/inflammation.).     Marland Kitchen Testosterone 1.62 % GEL Apply 1 Pump topically daily. On each shoulder  No results found for this or any previous visit (from the past 48 hour(s)). No results found.  Review of Systems  Blood pressure (!) 165/98, pulse 87, temperature 97.7 F (36.5 C), temperature source Oral, resp. rate 16, height 5\' 7"  (1.702 m), weight 69 kg, SpO2 98 %. 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 Plan right reverse total shoulder arthroplasty Risks / benefits of surgery discussed Consent on chart  NPO for OR Preop antibiotics   Isabella Stalling, MD 02/05/2020, 8:51 AM

## 2020-02-05 NOTE — Progress Notes (Signed)
Assisted Dr. Foster with right, ultrasound guided, interscalene  block. Side rails up, monitors on throughout procedure. See vital signs in flow sheet. Tolerated Procedure well. 

## 2020-02-05 NOTE — Op Note (Signed)
Procedure(s): REVERSE TOTAL SHOULDER ARTHROPLASTY Procedure Note  Brent Jordan male 74 y.o. 02/05/2020  Preoperative diagnosis: Right shoulder rotator cuff tear arthropathy  Postoperative diagnosis: Same  Procedure(s) and Anesthesia Type:    R REVERSE TOTAL SHOULDER ARTHROPLASTY - General   Indications:  74 y.o. 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 Pain and dysfunction interfered with quality of life and nonoperative treatment with activity modification, NSAIDS and injections failed.     Surgeon: Isabella Stalling   Assistants: Jeanmarie Hubert PA-C [facility] was present and scrubbed throughout the procedure and was essential in positioning, retraction, exposure, and closure)  Anesthesia: General endotracheal anesthesia with preoperative interscalene block given by the attending anesthesiologist    Procedure Detail  REVERSE TOTAL SHOULDER ARTHROPLASTY   Estimated Blood Loss:  200 mL         Drains: none  Blood Given: none          Specimens: none        Complications:  * No complications entered in OR log *         Disposition: PACU - hemodynamically stable.         Condition: stable      OPERATIVE FINDINGS:  A DJO Altivate pressfit reverse total shoulder arthroplasty was placed with a  size 10 stem, a 32 standard glenosphere, and a standard poly insert. The base plate  fixation was excellent.  PROCEDURE: The patient was identified in the preoperative holding area  where I personally marked the operative site after verifying site, side,  and procedure with the patient. An interscalene block given by  the attending anesthesiologist in the holding area and the patient was taken back to the operating room where all extremities were  carefully padded in position after general anesthesia was induced. She  was placed in a beach-chair position and the operative upper extremity was  prepped and draped in a standard  sterile fashion. An approximately 10-  cm incision was made from the tip of the coracoid process to the center  point of the humerus at the level of the axilla. Dissection was carried  down through subcutaneous tissues to the level of the cephalic vein  which was taken laterally with the deltoid. The pectoralis major was  retracted medially. The subdeltoid space was developed and the lateral  edge of the conjoined tendon was identified. The undersurface of  conjoined tendon was palpated and the musculocutaneous nerve was not in  the field. Retractor was placed underneath the conjoined and second  retractor was placed lateral into the deltoid. The circumflex humeral  artery and vessels were identified and clamped and coagulated. The  biceps tendon was tenodesed to the upper border of the pectoralis major.  The subscapularis was taken down as a peel and was noted to be very thin.  The  joint was then gently externally rotated while the capsule was released  from the humeral neck around to just beyond the 6 o'clock position. At  this point, the joint was dislocated and the humeral head was presented  into the wound. The excessive osteophyte formation was removed with a  large rongeur.  The cutting guide was used to make the appropriate  head cut and the head was saved for potentially bone grafting.  The glenoid was exposed with the arm in an  abducted extended position. The anterior and posterior labrum were  completely excised and the capsule was released circumferentially to  allow for exposure  of the glenoid for preparation. The 2.5 mm drill was  placed using the guide in 5-10 inferior angulation and the tap was then advanced in the same hole. Small and large reamers were then used. The tap was then removed and the Metaglene was then screwed in with excellent purchase.  The peripheral guide was then used to drilled measured and filled peripheral locking screws. The size 32 standard glenosphere  was then impacted on the [employer] taper and the central screw was placed. The humerus was then again exposed and the diaphyseal reamers were used followed by the metaphyseal reamers. The final broach was left in place in the proximal trial was placed. The joint was reduced and with this implant it was felt that soft tissue tensioning was appropriate with excellent stability and excellent range of motion. Therefore, final humeral stem was placed press-fit.  And then the trial polyethylene inserts were tested again and the above implant was felt to be the most appropriate for final insertion. The joint was reduced taken through full range of motion and felt to be stable. Soft tissue tension was appropriate.  The joint was then copiously irrigated with pulse  lavage and the wound was then closed. The subscapularis was not repaired.  Skin was closed with 2-0 Vicryl in a deep dermal layer and 4-0  Monocryl for skin closure. Steri-Strips were applied. Sterile  dressings were then applied as well as a sling. The patient was allowed  to awaken from general anesthesia, transferred to stretcher, and taken  to recovery room in stable condition.   POSTOPERATIVE PLAN: The patient will be kept in the hospital postoperatively for pain control and therapy.

## 2020-02-05 NOTE — Discharge Instructions (Signed)
Discharge Instructions after Reverse Total Shoulder Arthroplasty   . A sling has been provided for you. You are to wear this at all times (except for bathing and dressing), until your first post operative visit with Dr. Chandler. Please also wear while sleeping at night. While you bath and dress, let the arm/elbow extend straight down to stretch your elbow. Wiggle your fingers and pump your first while your in the sling to prevent hand swelling. . Use ice on the shoulder intermittently over the first 48 hours after surgery. Continue to use ice or and ice machine as needed after 48 hours for pain control/swelling.  . Pain medicine has been prescribed for you.  . Use your medicine liberally over the first 48 hours, and then you can begin to taper your use. You may take Extra Strength Tylenol or Tylenol only in place of the pain pills. DO NOT take ANY nonsteroidal anti-inflammatory pain medications: Advil, Motrin, Ibuprofen, Aleve, Naproxen or Naprosyn.  . Take one aspirin a day for 2 weeks after surgery, unless you have an aspirin sensitivity/allergy or asthma.  . Leave your dressing on until your first follow up visit.  You may shower with the dressing.  Hold your arm as if you still have your sling on while you shower. . Simply allow the water to wash over the site and then pat dry. Make sure your axilla (armpit) is completely dry after showering.    Please call [phone] during normal business hours or [phone] after hours for any problems. Including the following:  - excessive redness of the incisions - drainage for more than 4 days - fever of more than 101.5 F  *Please note that pain medications will not be refilled after hours or on weekends.     

## 2020-02-05 NOTE — Anesthesia Procedure Notes (Signed)
Procedure Name: Intubation Date/Time: 02/05/2020 9:41 AM Performed by: Niel Hummer, CRNA Pre-anesthesia Checklist: Patient identified, Emergency Drugs available, Suction available and Patient being monitored Patient Re-evaluated:Patient Re-evaluated prior to induction Oxygen Delivery Method: Circle system utilized Preoxygenation: Pre-oxygenation with 100% oxygen Induction Type: IV induction Ventilation: Mask ventilation without difficulty Laryngoscope Size: Mac and 4 Grade View: Grade I Tube type: Oral Tube size: 7.5 mm Number of attempts: 1 Airway Equipment and Method: Stylet Placement Confirmation: ETT inserted through vocal cords under direct vision,  positive ETCO2 and breath sounds checked- equal and bilateral Secured at: 21 cm Tube secured with: Tape Dental Injury: Teeth and Oropharynx as per pre-operative assessment

## 2020-02-05 NOTE — Anesthesia Procedure Notes (Signed)
Anesthesia Regional Block: Interscalene brachial plexus block   Pre-Anesthetic Checklist: ,, timeout performed, Correct Patient, Correct Site, Correct Laterality, Correct Procedure, Correct Position, site marked, Risks and benefits discussed,  Surgical consent,  Pre-op evaluation,  At surgeon's request and post-op pain management  Laterality: Right  Prep: chloraprep       Needles:  Injection technique: Single-shot  Needle Type: Echogenic Stimulator Needle     Needle Length: 9cm  Needle Gauge: 21   Needle insertion depth: 7 cm   Additional Needles:   Procedures:,,,, ultrasound used (permanent image in chart),,,,  Narrative:  Start time: 02/05/2020 8:56 AM End time: 02/05/2020 9:01 AM Injection made incrementally with aspirations every 5 mL.  Performed by: Personally  Anesthesiologist: Mal Amabile, MD  Additional Notes: Timeout performed. Patient sedated. Relevant anatomy ID'd using [place]. Incremental 2-46ml injection of LA with frequent aspiration. Patient tolerated procedure well.        Right Interscalene Block

## 2020-02-05 NOTE — Evaluation (Signed)
Occupational Therapy Evaluation Patient Details Name: Brent Jordan MRN: [id number] DOB: [birth year] Today's Date: 02/05/2020    History of Present Illness Patient is a 74 year old male s/p right reverse total shoulder surgery.   Clinical Impression   Patient with functional deficits listed below due to pain in R shoulder, immobilization s/p surgery. Patient, spouse and patient's daughter present for all education listed below and verbalize/demonstrate understanding. Will continue to follow during hospitalization.     Follow Up Recommendations  Follow surgeon's recommendation for DC plan and follow-up therapies;Home health OT;Other (comment)(pt/spouse mentioned HH already set up)    Equipment Recommendations  None recommended by OT       Precautions / Restrictions Precautions Precautions: Shoulder Type of Shoulder Precautions: AROM elbow wrist and hand, no P or AROM shoulder Shoulder Interventions: Shoulder sling/immobilizer;Off for dressing/bathing/exercises Precaution Booklet Issued: Yes (comment) Required Braces or Orthoses: Sling Restrictions Weight Bearing Restrictions: Yes RUE Weight Bearing: Non weight bearing      Mobility Bed Mobility Overal bed mobility: Needs Assistance Bed Mobility: Supine to Sit;Sit to Supine     Supine to sit: Supervision;HOB elevated Sit to supine: Supervision;HOB elevated   General bed mobility comments: S for safety with shoulder, cues not to push through R UE when scooting in bed  Transfers Overall transfer level: Needs assistance Equipment used: None Transfers: Sit to/from Stand Sit to Stand: Min guard;Min assist         General transfer comment: for steadying assist only    Balance Overall balance assessment: Mild deficits observed, not formally tested                                         ADL either performed or assessed with clinical judgement   ADL Overall ADL's : Needs assistance/impaired      Grooming: Set up;Sitting;Standing   Upper Body Bathing: Minimal assistance;Sitting   Lower Body Bathing: Minimal assistance;Sitting/lateral leans;Sit to/from stand   Upper Body Dressing : Moderate assistance;Sitting   Lower Body Dressing: Minimal assistance;Sitting/lateral leans;Sit to/from stand   Toilet Transfer: Minimal assistance;Ambulation Toilet Transfer Details (indicate cue type and reason): pt stood at toilet to urinate, min A for safety with balance as patient mildly unsteady. first time up since surgery Toileting- Clothing Manipulation and Hygiene: Minimal assistance;Sit to/from stand       Functional mobility during ADLs: Minimal assistance(IV pole) General ADL Comments: patient requires increased assistance for self care tasks due to pain/shoulder immobilization                  Pertinent Vitals/Pain Pain Assessment: Faces Faces Pain Scale: Hurts little more Pain Location: R shoulder, R lower back Pain Descriptors / Indicators: Burning;Grimacing;Discomfort Pain Intervention(s): Monitored during session     Hand Dominance Right   Extremity/Trunk Assessment Upper Extremity Assessment Upper Extremity Assessment: RUE deficits/detail RUE Deficits / Details: nerve block still present RUE: Unable to fully assess due to immobilization       Cervical / Trunk Assessment Cervical / Trunk Assessment: Normal   Communication Communication Communication: No difficulties   Cognition Arousal/Alertness: Awake/alert Behavior During Therapy: WFL for tasks assessed/performed Overall Cognitive Status: Within Functional Limits for tasks assessed                                     General Comments  provided patient and with education regarding compensatory strategies for ADLs, exercise program for distal ROM which patient return demo with L UE as R UE still numb from nerve block,    Exercises Exercises: Shoulder   Shoulder Instructions Shoulder  Instructions Donning/doffing shirt without moving shoulder: Patient able to independently direct caregiver Method for sponge bathing under operated UE: Patient able to independently direct caregiver Donning/doffing sling/immobilizer: Patient able to independently direct caregiver Correct positioning of sling/immobilizer: Patient able to independently direct caregiver ROM for elbow, wrist and digits of operated UE: Independent Sling wearing schedule (on at all times/off for ADL's): Independent;Patient able to independently direct caregiver    Home Living Family/patient expects to be discharged to:: Private residence Living Arrangements: Spouse/significant other Available Help at Discharge: Family;Available 24 hours/day Type of Home: House Home Access: Stairs to enter [employer] of Steps: 1 Entrance Stairs-Rails: None Home Layout: Two level;Laundry or work area in basement     [employer] Shower/Tub: [employer];Tub/shower unit         Home Equipment: Shower seat          Prior Functioning/Environment Level of Independence: Independent                 OT Problem List: Decreased activity tolerance;Impaired balance (sitting and/or standing);Decreased range of motion;Decreased strength;Decreased safety awareness;Decreased knowledge of precautions;Impaired UE functional use;Pain         OT Goals(Current goals can be found in the care plan section) Acute Rehab OT Goals Patient Stated Goal: "be able to exercise again" OT Goal Formulation: With patient Time For Goal Achievement: 02/19/20 Potential to Achieve Goals: Good      AM-PAC OT "6 Clicks" Daily Activity     Outcome Measure Help from another person eating meals?: None Help from another person taking care of personal grooming?: A Little Help from another person toileting, which includes using toliet, bedpan, or urinal?: A Little Help from another person bathing (including washing, rinsing, drying)?: A  Little Help from another person to put on and taking off regular upper body clothing?: A Lot Help from another person to put on and taking off regular lower body clothing?: A Little 6 Click Score: 18   End of Session Equipment Utilized During Treatment: (sling)  Activity Tolerance: Patient tolerated treatment well Patient left: in bed;with call bell/phone within reach;with family/visitor present  OT Visit Diagnosis: Pain Pain - Right/Left: Right Pain - part of body: Shoulder                Time: 1400-1450 OT Time Calculation (min): 50 min Charges:  OT General Charges $OT Visit: 1 Visit OT Evaluation $OT Eval Moderate Complexity: 1 Mod  Marlyce Huge OT Pager: [phone]  Carmelia Roller 02/05/2020, 3:20 PM

## 2020-02-05 NOTE — Transfer of Care (Signed)
Immediate Anesthesia Transfer of Care Note  Patient: Brent Jordan  Procedure(s) Performed: REVERSE TOTAL SHOULDER ARTHROPLASTY (Right Shoulder)  Patient Location: PACU  Anesthesia Type:General  Level of Consciousness: awake, alert  and oriented  Airway & Oxygen Therapy: Patient Spontanous Breathing and Patient connected to face mask oxygen  Post-op Assessment: Report given to RN and Post -op Vital signs reviewed and stable  Post vital signs: Reviewed and stable  Last Vitals:  Vitals Value Taken Time  BP 137/83 02/05/20 1113  Temp    Pulse 66 02/05/20 1114  Resp 14 02/05/20 1114  SpO2 99 % 02/05/20 1114  Vitals shown include unvalidated device data.  Last Pain:  Vitals:   02/05/20 0833  TempSrc:   PainSc: 0-No pain      Patients Stated Pain Goal: 4 (02/05/20 0757)  Complications: No apparent anesthesia complications

## 2020-02-05 NOTE — Anesthesia Postprocedure Evaluation (Signed)
Anesthesia Post Note  Patient: Brent Jordan  Procedure(s) Performed: REVERSE TOTAL SHOULDER ARTHROPLASTY (Right Shoulder)     Patient location during evaluation: PACU Anesthesia Type: General Level of consciousness: awake and alert Pain management: pain level controlled Vital Signs Assessment: post-procedure vital signs reviewed and stable Respiratory status: spontaneous breathing, nonlabored ventilation and respiratory function stable Cardiovascular status: blood pressure returned to baseline and stable Postop Assessment: no apparent nausea or vomiting Anesthetic complications: no    Last Vitals:  Vitals:   02/05/20 1130 02/05/20 1145  BP: 125/76 135/78  Pulse: 68 69  Resp: 15 16  Temp:    SpO2: 96% 100%    Last Pain:  Vitals:   02/05/20 1145  TempSrc:   PainSc: 0-No pain                 Brek Reece,Micky A.

## 2020-02-06 DIAGNOSIS — M75101 Unspecified rotator cuff tear or rupture of right shoulder, not specified as traumatic: Secondary | ICD-10-CM | POA: Diagnosis not present

## 2020-02-06 LAB — BASIC METABOLIC PANEL
Anion gap: 9 (ref 5–15)
BUN: 22 mg/dL (ref 8–23)
CO2: 26 mmol/L (ref 22–32)
Calcium: 9.3 mg/dL (ref 8.9–10.3)
Chloride: 104 mmol/L (ref 98–111)
Creatinine, Ser: 1.18 mg/dL (ref 0.61–1.24)
GFR calc Af Amer: >60 mL/min (ref >60)
GFR calc non Af Amer: >60 mL/min (ref >60)
Glucose, Bld: 167 mg/dL — ABNORMAL HIGH (ref 70–99)
Potassium: 4.7 mmol/L (ref 3.5–5.1)
Sodium: 139 mmol/L (ref 135–145)

## 2020-02-06 LAB — CBC
HCT: 42.3 % (ref 39.0–52.0)
Hemoglobin: 13.8 g/dL (ref 13.0–17.0)
MCH: 29.2 pg (ref 26.0–34.0)
MCHC: 32.6 g/dL (ref 30.0–36.0)
MCV: 89.6 fL (ref 80.0–100.0)
Platelets: 143 10*3/uL — ABNORMAL LOW (ref 150–400)
RBC: 4.72 MIL/uL (ref 4.22–5.81)
RDW: 13.2 % (ref 11.5–15.5)
WBC: 10.9 10*3/uL — ABNORMAL HIGH (ref 4.0–10.5)
nRBC: 0 % (ref 0.0–0.2)

## 2020-02-06 MED ORDER — TIZANIDINE HCL 4 MG PO TABS: 4.0000 mg | ORAL_TABLET | Freq: Three times a day (TID) | ORAL | 1 refills | Status: AC | PRN

## 2020-02-06 MED ORDER — OXYCODONE-ACETAMINOPHEN 5-325 MG PO TABS: ORAL_TABLET | ORAL | 0 refills | Status: AC

## 2020-02-06 NOTE — Progress Notes (Signed)
   PATIENT ID: Brent Jordan   1 Day Post-Op Procedure(s) (LRB): REVERSE TOTAL SHOULDER ARTHROPLASTY (Right)  Subjective: The patient is doing well this morning.  He has no significant pain.  No complaints this morning.  Objective:  Vitals:   02/06/20 0214 02/06/20 0545  BP: 120/73 125/76  Pulse: 63 63  Resp: 18 18  Temp: 97.8 F (36.6 C) 98.3 F (36.8 C)  SpO2: 98% 99%     Right shoulder shows dressing to be clean dry and intact.  He notes some decreased sensation still in the thumb and index finger but is moving the hand well.  Labs:  Recent Labs    02/06/20 0242  HGB 13.8   Recent Labs    02/06/20 0242  WBC 10.9*  RBC 4.72  HCT 42.3  PLT 143*   Recent Labs    02/06/20 0242  NA 139  K 4.7  CL 104  CO2 26  BUN 22  CREATININE 1.18  GLUCOSE 167*  CALCIUM 9.3    Assessment and Plan:Doing well postoperative day 1 status post right reverse total shoulder arthroplasty Standard precautions were reviewed.  He should be fine for discharge home today. Follow-up in 2 weeks in my office.

## 2020-02-06 NOTE — Discharge Summary (Signed)
Patient ID: Brent Jordan MRN: [id number] DOB/AGE: 02/25/1946 74 y.o.  Admit date: 02/05/2020 Discharge date: 02/06/2020  Admission Diagnoses:  Active Problems:   S/P reverse total shoulder arthroplasty, right   Discharge Diagnoses:  Same  Past Medical History:  Diagnosis Date  . Anemia   . Aortic valve stenosis    s/p TAVR  . GERD (gastroesophageal reflux disease)   . Heart murmur   . Hyperlipidemia   . Hypertension    No medications  . 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 Pneumonia    4th grade and 1970  . 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 Syncope    at [employer]    Surgeries: Procedure(s): REVERSE TOTAL SHOULDER ARTHROPLASTY on 02/05/2020   Consultants:   Discharged Condition: Improved  Hospital Course: Dwayne Bulkley is an 74 y.o. 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 Patient has severe unremitting pain that affects sleep, daily activities, and work/hobbies. After pre-op clearance the patient was taken to the operating room on 02/05/2020 and underwent  Procedure(s): REVERSE TOTAL SHOULDER ARTHROPLASTY.    Patient was given perioperative antibiotics:  Anti-infectives (From admission, onward)   Start     Dose/Rate Route Frequency Ordered Stop   02/05/20 1600  ceFAZolin (ANCEF) IVPB 1 g/50 mL premix     1 g 100 mL/hr over 30 Minutes Intravenous Every 6 hours 02/05/20 1217 02/06/20 0508   02/05/20 0800  ceFAZolin (ANCEF) IVPB 2g/100 mL premix     2 g 200 mL/hr over 30 Minutes Intravenous On call to O.R. 02/05/20 0757 02/05/20 1011       Patient was given sequential compression devices, early ambulation, and asa to prevent DVT.  Patient benefited maximally from hospital stay and there were no complications.    Recent vital signs:  Patient Vitals for the past 24 hrs:  BP Temp Temp src Pulse Resp SpO2 Height Weight  02/06/20 0545 125/76 98.3 F (36.8 C) Oral 63 18 99  % - -  02/06/20 0214 120/73 97.8 F (36.6 C) Oral 63 18 98 % - -  02/05/20 2311 (!) 130/91 97.9 F (36.6 C) Oral 81 20 99 % - -  02/05/20 1520 139/86 97.7 F (36.5 C) Oral 90 16 97 % - -  02/05/20 1420 137/86 - - 84 16 98 % - -  02/05/20 1322 (!) 146/88 - - 75 16 100 % - -  02/05/20 1220 (!) 141/80 97.7 F (36.5 C) Oral - 14 100 % - -  02/05/20 1200 126/77 97.6 F (36.4 C) - 67 14 100 % - -  02/05/20 1145 135/78 - - 69 16 100 % - -  02/05/20 1130 125/76 - - 68 15 96 % - -  02/05/20 1115 134/82 - - 64 13 99 % - -  02/05/20 1113 137/83 97.6 F (36.4 C) - 69 14 99 % - -  02/05/20 0918 123/68 - - 73 17 99 % - -  02/05/20 0903 129/74 - - 75 14 95 % - -  02/05/20 0900 - - - 67 13 99 % - -  02/05/20 0858 (!) 152/91 - - 68 (!) 22 99 % - -  02/05/20 0853 (!) 148/80 - - 73 (!) 23 97 % - -  02/05/20 0833 - - - - - - 5\' 7"  (1.702 m) 69 kg  02/05/20 0744 (!) 165/98 97.7 F (36.5 C) Oral 87 16 98 % - -  Recent laboratory studies:  Recent Labs    02/06/20 0242  WBC 10.9*  HGB 13.8  HCT 42.3  PLT 143*  NA 139  K 4.7  CL 104  CO2 26  BUN 22  CREATININE 1.18  GLUCOSE 167*  CALCIUM 9.3     Discharge Medications:   Allergies as of 02/06/2020      Reactions   Levaquin [levofloxacin In D5w] Other (See Comments)   Bell palsy      Medication List    STOP taking these medications   acetaminophen 500 MG tablet Commonly known as: TYLENOL     TAKE these medications   aspirin EC 81 MG tablet Take 81 mg by mouth at bedtime.   atorvastatin 10 MG tablet Commonly known as: LIPITOR Take 10 mg by mouth at bedtime.   cetirizine 10 MG tablet Commonly known as: ZYRTEC Take 10 mg by mouth daily.   diclofenac 75 MG EC tablet Commonly known as: VOLTAREN Take 75 mg by mouth 2 (two) times daily as needed (pain/inflammation.).   DULoxetine 30 MG capsule Commonly known as: CYMBALTA Take 30 mg by mouth at bedtime.   magnesium oxide 400 MG tablet Commonly known as: MAG-OX Take  400 mg by mouth at bedtime.   mirtazapine 15 MG tablet Commonly known as: REMERON Take 15 mg by mouth at bedtime.   montelukast 10 MG tablet Commonly known as: SINGULAIR Take 10 mg by mouth at bedtime.   Nasacort Allergy 24HR 55 MCG/ACT Aero nasal inhaler Generic drug: triamcinolone Place 2 sprays into the nose daily as needed (allergies.).   omeprazole 40 MG capsule Commonly known as: PRILOSEC Take 40 mg by mouth daily before breakfast.   oxyCODONE-acetaminophen 5-325 MG tablet Commonly known as: Percocet Take 1-2 tablets every 4 hours as needed for post operative pain. MAX 6/day What changed:   how much to take  how to take this  when to take this  additional instructions   tamsulosin 0.4 MG Caps capsule Commonly known as: FLOMAX Take 0.4 mg by mouth in the morning and at bedtime.   Testosterone 1.62 % Gel Apply 1 Pump topically daily. On each shoulder   tiZANidine 4 MG tablet Commonly known as: Zanaflex Take 1 tablet (4 mg total) by mouth every 8 (eight) hours as needed for muscle spasms.   Vitamin D3 50 MCG (2000 UT) Tabs Take 2,000 Units by mouth daily.   vitamin E 180 MG (400 UNITS) capsule Take 400 Units by mouth daily.       Diagnostic Studies: DG Shoulder Right Port  Result Date: 02/05/2020 CLINICAL DATA:  Postop from right shoulder arthroplasty. EXAM: PORTABLE RIGHT SHOULDER COMPARISON:  None. FINDINGS: Reverse total right shoulder arthroplasty appears well seated and aligned. No evidence of an operative complication. IMPRESSION: Well-positioned total right shoulder reverse arthroplasty. Electronically Signed   By: Lajean Manes M.D.   On: 02/05/2020 13:52    Disposition: Discharge disposition: 01-Home or Self Care       Discharge Instructions    Call MD / Call 911   Complete by: As directed    If you experience chest pain or shortness of breath, CALL 911 and be transported to the hospital emergency room.  If you develope a fever above 101  F, pus (white drainage) or increased drainage or redness at the wound, or calf pain, call your surgeon's office.   Constipation Prevention   Complete by: As directed    Drink plenty of fluids.  Prune juice may  be helpful.  You may use a stool softener, such as Colace (over the counter) 100 mg twice a day.  Use MiraLax (over the counter) for constipation as needed.   Diet - low sodium heart healthy   Complete by: As directed    Increase activity slowly as tolerated   Complete by: As directed       Follow-up Information    Jones Broom, MD. Schedule an appointment as soon as possible for a visit in 2 weeks.   Specialty: Orthopedic Surgery Contact information: [address] [place] [address] [phone]            Signed: Jiles Harold 02/06/2020, 7:30 AM

## 2020-02-06 NOTE — Progress Notes (Signed)
Orthopedic Tech Progress Note Patient Details:  Brent Jordan 07-10-1946 [id number]  Ortho Devices Type of Ortho Device: Arm sling Ortho Device/Splint Location: replacement Ortho Device/Splint Interventions: Application   Post Interventions Patient Tolerated: Well Instructions Provided: Care of device   [name] 02/06/2020, 9:34 AM

## 2020-02-06 NOTE — Progress Notes (Signed)
Occupational Therapy Treatment Patient Details Name: Brent Jordan MRN: [id number] DOB: 1945-11-29 Today's Date: 02/06/2020    History of present illness Patient is a 74 year old male s/p right reverse total shoulder surgery.   OT comments  Patient with increased sensation up to right shoulder, still having difficulty with AROM exercises for elbow and wrist, performed P and AA using left upper extremity. Addressed patient concerns/questions and called ortho tech for medium sling to be delivered to patient's room.    Follow Up Recommendations  Follow surgeon's recommendation for DC plan and follow-up therapies;Home health OT;Other (comment)(pt has HH already set up)    Equipment Recommendations  None recommended by OT       Precautions / Restrictions Precautions Precautions: Shoulder Type of Shoulder Precautions: AROM elbow wrist and hand, no P or AROM shoulder Shoulder Interventions: Shoulder sling/immobilizer;Off for dressing/bathing/exercises Precaution Booklet Issued: Yes (comment) Required Braces or Orthoses: Sling Restrictions Weight Bearing Restrictions: Yes RUE Weight Bearing: Non weight bearing       Mobility Bed Mobility Overal bed mobility: Needs Assistance Bed Mobility: Supine to Sit     Supine to sit: Supervision;HOB elevated     General bed mobility comments: S for safety with shoulder  Transfers Overall transfer level: Needs assistance Equipment used: None Transfers: Sit to/from Stand Sit to Stand: Supervision              Balance Overall balance assessment: Independent                                           Cognition Arousal/Alertness: Awake/alert Behavior During Therapy: WFL for tasks assessed/performed Overall Cognitive Status: Within Functional Limits for tasks assessed                                          Exercises Exercises: Shoulder Shoulder Exercises Elbow Flexion: PROM;Right;5  reps;Seated Elbow Extension: PROM;Right;5 reps;Seated Wrist Flexion: AAROM;Right;5 reps;Seated Wrist Extension: AAROM;Right;5 reps;Seated Digit Composite Flexion: AROM;10 reps;Seated Composite Extension: AROM;Right;10 reps;Seated           Pertinent Vitals/ Pain       Pain Assessment: Faces Faces Pain Scale: Hurts a little bit Pain Location: R shoulder Pain Descriptors / Indicators: Discomfort Pain Intervention(s): Monitored during session;Premedicated before session         Frequency  Min 3X/week        Progress Toward Goals  OT Goals(current goals can now be found in the care plan section)  Progress towards OT goals: Progressing toward goals  Acute Rehab OT Goals Patient Stated Goal: "be able to exercise again" OT Goal Formulation: With patient Time For Goal Achievement: 02/19/20 Potential to Achieve Goals: Good ADL Goals Pt Will Perform Grooming: with modified independence;sitting;standing Pt Will Perform Upper Body Dressing: with supervision;standing;sitting Pt Will Perform Lower Body Dressing: Independently;sit to/from stand;sitting/lateral leans Pt Will Transfer to Toilet: Independently;ambulating Pt/caregiver will Perform Home Exercise Program: Right Upper extremity;With written HEP provided;Independently  Plan Discharge plan remains appropriate       AM-PAC OT "6 Clicks" Daily Activity     Outcome Measure   Help from another person eating meals?: None Help from another person taking care of personal grooming?: A Little Help from another person toileting, which includes using toliet, bedpan, or urinal?: A Little Help from  another person bathing (including washing, rinsing, drying)?: A Little Help from another person to put on and taking off regular upper body clothing?: A Lot Help from another person to put on and taking off regular lower body clothing?: A Little 6 Click Score: 18    End of Session Equipment Utilized During Treatment: (sling)  OT  Visit Diagnosis: Pain Pain - Right/Left: Right Pain - part of body: Shoulder   Activity Tolerance Patient tolerated treatment well   Patient Left in chair;with call bell/phone within reach   Nurse Communication Mobility status        Time: 6701-4103 OT Time Calculation (min): 26 min  Charges: OT General Charges $OT Visit: 1 Visit OT Treatments $Self Care/Home Management : 8-22 mins $Therapeutic Exercise: 8-22 mins  Marlyce Huge OT Pager: [phone]   Carmelia Roller 02/06/2020, 9:08 AM

## 2020-02-09 ENCOUNTER — Encounter: Payer: Self-pay | Admitting: *Deleted

## 2021-07-22 IMAGING — CR DG CHEST 2V
2 series · 2 of 2 positions shown · non-contrast
Comparison: None

CLINICAL DATA: Preoperative for total shoulder replacement

EXAM:
CHEST - 2 VIEW

[w chest pa]
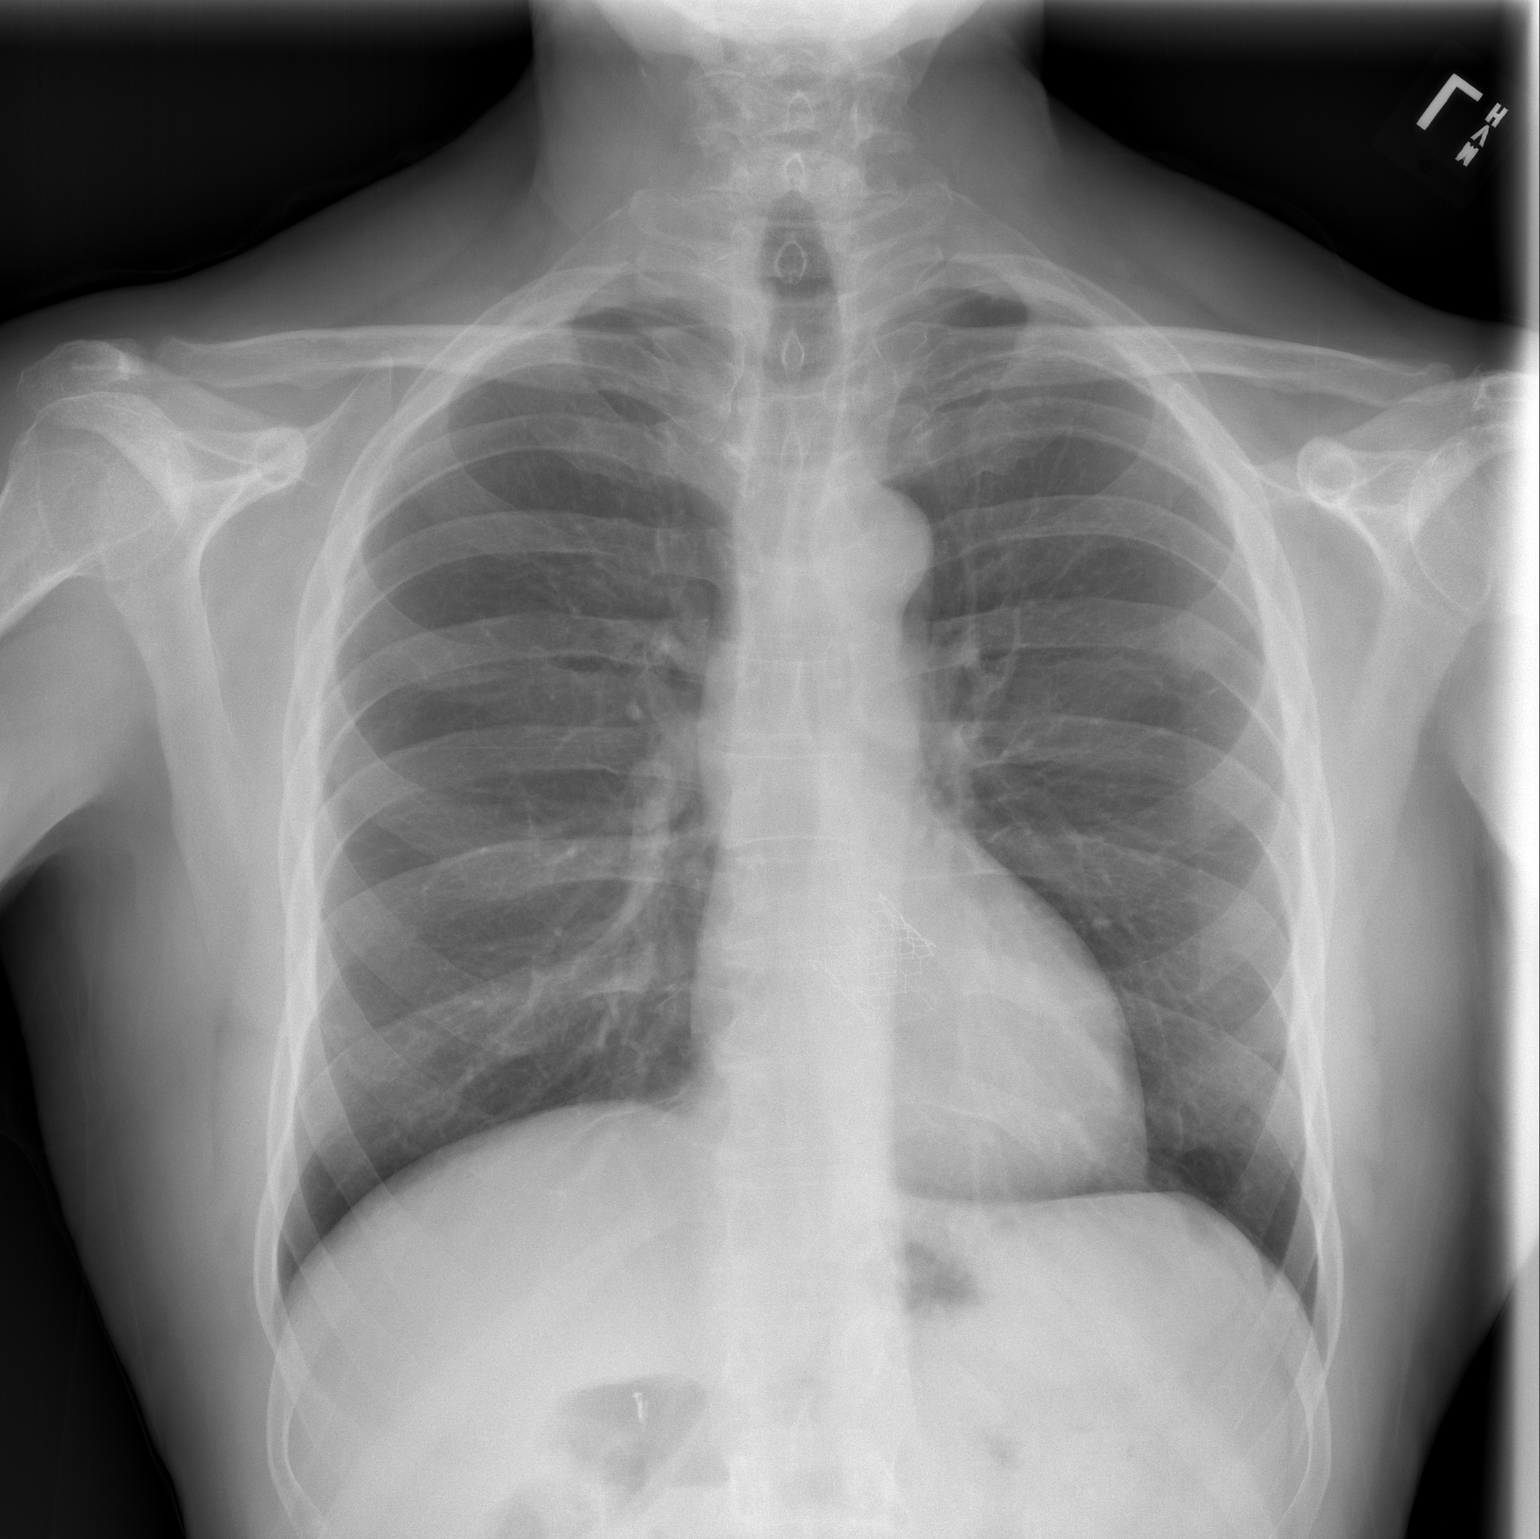

[w chest lat]
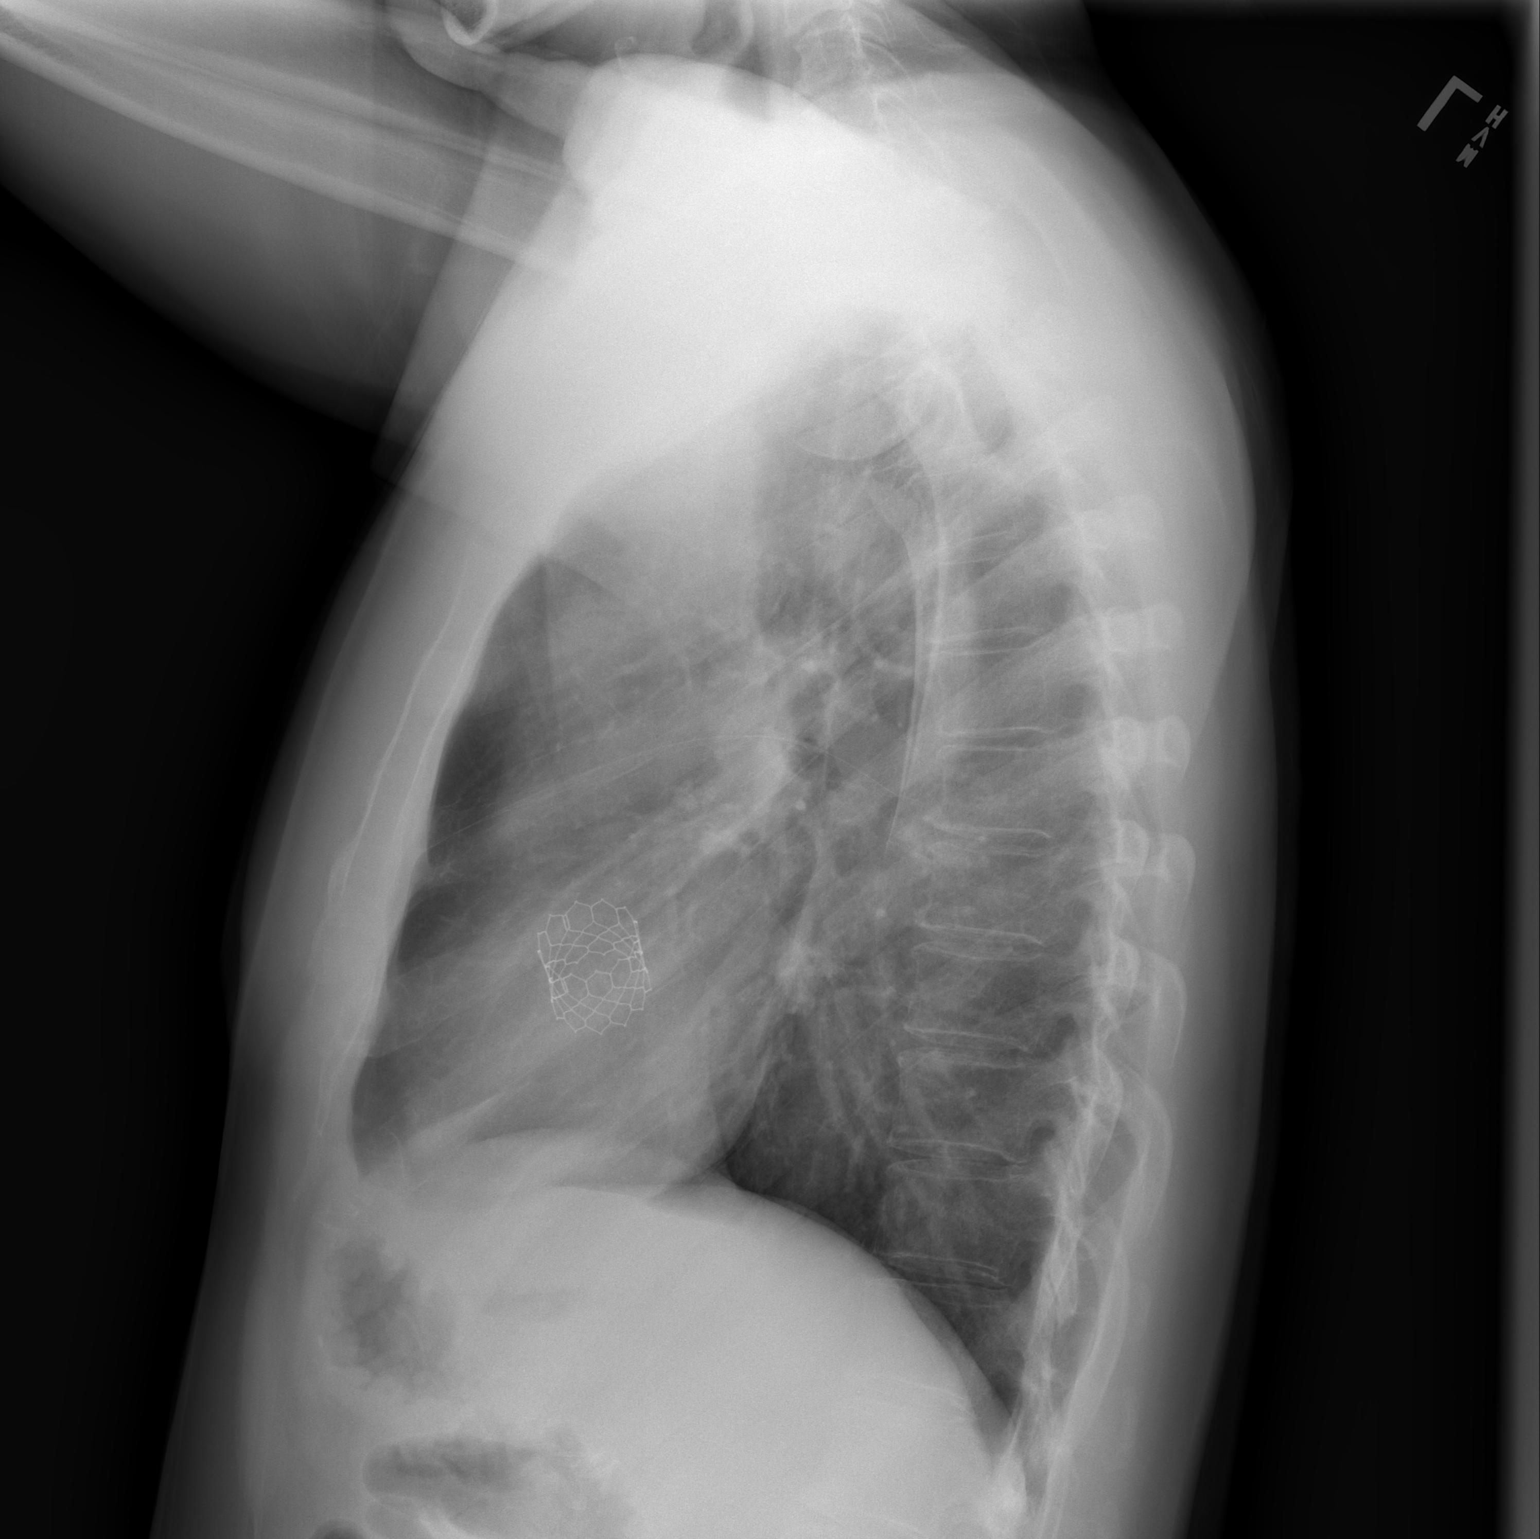

[2 of 2 positions shown; findings below may reference images not displayed]

FINDINGS: Expected location of an aortic valve stent graft repair. The
cardiomediastinal contours are unremarkable. No consolidation,
features of edema, pneumothorax, or effusion. Cholecystectomy clips
in the right upper quadrant.
IMPRESSION: No acute cardiopulmonary abnormality.
